# Patient Record
Sex: Male | Born: 1970 | Race: White | Hispanic: No | Marital: Single | Smoking: Never smoker
Health system: Southern US, Community
[De-identification: ages and names within clinical notes are randomized; demographics above are authoritative.]

---

## 2010-04-25 ENCOUNTER — Inpatient Hospital Stay: Payer: Self-pay | Admitting: Surgery

## 2016-02-18 ENCOUNTER — Encounter: Payer: Self-pay | Admitting: Emergency Medicine

## 2016-02-18 ENCOUNTER — Emergency Department
Admission: EM | Admit: 2016-02-18 | Discharge: 2016-02-18 | Disposition: A | Discharge status: Home / Self Care | Payer: Managed Care, Other (non HMO) | Attending: Emergency Medicine | Admitting: Emergency Medicine

## 2016-02-18 ENCOUNTER — Emergency Department: Payer: Managed Care, Other (non HMO)

## 2016-02-18 DIAGNOSIS — R109 Unspecified abdominal pain: Secondary | ICD-10-CM | POA: Diagnosis present

## 2016-02-18 DIAGNOSIS — N202 Calculus of kidney with calculus of ureter: Secondary | ICD-10-CM | POA: Diagnosis not present

## 2016-02-18 DIAGNOSIS — N200 Calculus of kidney: Secondary | ICD-10-CM

## 2016-02-18 LAB — CBC
HEMATOCRIT: 46.3 % (ref 40.0–52.0)
HEMOGLOBIN: 15.9 g/dL (ref 13.0–18.0)
MCH: 30.3 pg (ref 26.0–34.0)
MCHC: 34.3 g/dL (ref 32.0–36.0)
MCV: 88.3 fL (ref 80.0–100.0)
Platelets: 377 10*3/uL (ref 150–440)
RBC: 5.24 MIL/uL (ref 4.40–5.90)
RDW: 12.8 % (ref 11.5–14.5)
WBC: 12.2 10*3/uL — ABNORMAL HIGH (ref 3.8–10.6)

## 2016-02-18 LAB — URINALYSIS COMPLETE WITH MICROSCOPIC (ARMC ONLY)
BACTERIA UA: NONE SEEN
Bilirubin Urine: NEGATIVE
GLUCOSE, UA: NEGATIVE mg/dL
KETONES UR: NEGATIVE mg/dL
Leukocytes, UA: NEGATIVE
NITRITE: NEGATIVE
PROTEIN: 30 mg/dL — AB
SPECIFIC GRAVITY, URINE: 1.018 (ref 1.005–1.030)
Squamous Epithelial / LPF: NONE SEEN
pH: 5 (ref 5.0–8.0)

## 2016-02-18 LAB — COMPREHENSIVE METABOLIC PANEL
ALBUMIN: 4.4 g/dL (ref 3.5–5.0)
ALK PHOS: 78 U/L (ref 38–126)
ALT: 28 U/L (ref 17–63)
AST: 27 U/L (ref 15–41)
Anion gap: 8 (ref 5–15)
BUN: 12 mg/dL (ref 6–20)
CALCIUM: 9.3 mg/dL (ref 8.9–10.3)
CO2: 28 mmol/L (ref 22–32)
Chloride: 103 mmol/L (ref 101–111)
Creatinine, Ser: 0.96 mg/dL (ref 0.61–1.24)
GFR calc non Af Amer: >60 mL/min (ref >60)
GLUCOSE: 145 mg/dL — AB (ref 65–99)
Potassium: 3.6 mmol/L (ref 3.5–5.1)
Sodium: 139 mmol/L (ref 135–145)
Total Bilirubin: 0.9 mg/dL (ref 0.3–1.2)
Total Protein: 8.3 g/dL — ABNORMAL HIGH (ref 6.5–8.1)

## 2016-02-18 LAB — LIPASE, BLOOD: Lipase: 25 U/L (ref 11–51)

## 2016-02-18 MED ORDER — TAMSULOSIN HCL 0.4 MG PO CAPS
0.4000 mg | ORAL_CAPSULE | Freq: Every day | ORAL | 0 refills | Status: DC
Start: 2016-02-18 — End: 2020-01-12

## 2016-02-18 MED ORDER — ONDANSETRON HCL 4 MG/2ML IJ SOLN
4.0000 mg | Freq: Once | INTRAMUSCULAR | Status: AC
  Administered 2016-02-18: 4 mg via INTRAVENOUS

## 2016-02-18 MED ORDER — OXYCODONE-ACETAMINOPHEN 5-325 MG PO TABS: 1.0000 | ORAL_TABLET | Freq: Four times a day (QID) | ORAL | 0 refills | Status: DC | PRN

## 2016-02-18 MED ORDER — ONDANSETRON 4 MG PO TBDP: 4.0000 mg | ORAL_TABLET | Freq: Three times a day (TID) | ORAL | 0 refills | Status: DC | PRN

## 2016-02-18 MED ORDER — SODIUM CHLORIDE 0.9 % IV BOLUS (SEPSIS)
1000.0000 mL | Freq: Once | INTRAVENOUS | Status: AC
  Administered 2016-02-18: 1000 mL via INTRAVENOUS

## 2016-02-18 MED ORDER — KETOROLAC TROMETHAMINE 30 MG/ML IJ SOLN
30.0000 mg | Freq: Once | INTRAMUSCULAR | Status: AC
  Administered 2016-02-18: 30 mg via INTRAVENOUS
  Filled 2016-02-18: qty 1

## 2016-02-18 MED ORDER — ONDANSETRON HCL 4 MG/2ML IJ SOLN
INTRAMUSCULAR | Status: AC
  Administered 2016-02-18: 4 mg via INTRAVENOUS
  Filled 2016-02-18: qty 2

## 2016-02-18 NOTE — ED Notes (Signed)
Patient transported to CT 

## 2016-02-18 NOTE — ED Notes (Signed)
Pt c/o right flank pain with no hx (pos family hx) of KS.  Pt states the pain is radiating around his anterior abdomen on the right side.  Pt denies any blood in urine and states his pain is 8/10.

## 2016-02-18 NOTE — ED Notes (Signed)
MD at bedside. 

## 2016-02-18 NOTE — ED Provider Notes (Signed)
[facility] Emergency Department Provider Note  Time seen: 6:25 AM  I have reviewed the triage vital signs and the nursing notes.   HISTORY  Chief Complaint Flank Pain    HPI Manuel Rogers is a 45 y.o. male with no past medical history who presents the emergency department with right flank pain. According to the patient last night he urinated and had a sensation that he needed to urinate again but could not. This morning he states he urinated and had the same feeling, approximately 10 minutes later he developed sudden onset sharp severe pain in his right flank area. The patient states nausea but denies vomiting. Denies fever but states cold sweats at home.  History reviewed. No pertinent past medical history.  There are no active problems to display for this patient.   History reviewed. No pertinent surgical history.  Prior to Admission medications   Not on File    No Known Allergies  History reviewed. No pertinent family history.  Social History Social History  Substance Use Topics  . Smoking status: Never Smoker  . Smokeless tobacco: Never Used  . Alcohol use Yes    Review of Systems Constitutional: Negative for fever. Cardiovascular: Negative for chest pain. Respiratory: Negative for shortness of breath. Gastrointestinal: Positive for right flank pain. Positive for nausea. Negative for vomiting. Negative diarrhea. Genitourinary: Negative for dysuria. Negative for hematuria Musculoskeletal: Right back pain Neurological: Negative for headache 10-point ROS otherwise negative.  ____________________________________________   PHYSICAL EXAM:  VITAL SIGNS: ED Triage Vitals  Enc Vitals Group     BP 02/18/16 0618 125/83     Pulse Rate 02/18/16 0618 66     Resp 02/18/16 0614 18     Temp 02/18/16 0618 97.9 F (36.6 C)     Temp Source 02/18/16 0618 Oral     SpO2 02/18/16 0618 99 %     Weight --      Height --      Head Circumference --       Peak Flow --      Pain Score 02/18/16 0616 8     Pain Loc --      Pain Edu? --      Excl. in GC? --     Constitutional: Alert and oriented. Well appearing and in no distress. Eyes: Normal exam ENT   Head: Normocephalic and atraumatic.   Mouth/Throat: Mucous membranes are moist. Cardiovascular: Normal rate, regular rhythm. No murmur Respiratory: Normal respiratory effort without tachypnea nor retractions. Breath sounds are clear Gastrointestinal: Soft and nontender. No distention.  Mild right CVA tenderness palpation Musculoskeletal: Nontender with normal range of motion in all extremities. Neurologic:  Normal speech and language. No gross focal neurologic deficits Skin:  Skin is warm, dry and intact.  Psychiatric: Mood and affect are normal.   ____________________________________________   RADIOLOGY  CT shows 2 mm right distal ureteral stone. Also a 4.6 cm left renal mass/cyst.  ____________________________________________   INITIAL IMPRESSION / ASSESSMENT AND PLAN / ED COURSE  Pertinent labs & imaging results that were available during my care of the patient were reviewed by me and considered in my medical decision making (see chart for details).  The patient presents to the emergency department with sudden onset of right flank pain. Patient's story sounds very suggestive of ureterolithiasis. Patient has no history of kidney stones in the past. We will check labs, urinalysis, treat pain and nausea, IV hydrate and obtain a CT scan of the right flank  to further evaluate.  I discussed the CT findings with the patient including the kidney stone as well as the left renal mass. Patient [facility] for further workup including renal ultrasound. I discussed the possibility of cancer and the importance of following up, the patient understands and will do so. Patient states his pain is currently a  1/10.  ____________________________________________   FINAL CLINICAL IMPRESSION(S) / ED DIAGNOSES  Right flank pain Ureterolithiasis Kidney mass   Minna AntisKevin Adrick Kestler, MD 02/18/16 [phone]

## 2016-02-18 NOTE — Discharge Instructions (Signed)
As we have discussed your CT shows a right sided kidney stone. The CT scan also shows a 4.6 cm mass on the left kidney. [facility] by calling the number provided above to arrange a follow-up appointment as soon as possible for further evaluation and to obtain a renal ultrasound.

## 2016-02-18 NOTE — ED Triage Notes (Signed)
Pt arrived to the ED for complaints of right flank pain. Pt is AOx4 in no apparent distress.

## 2016-02-19 LAB — URINE CULTURE: Culture: <10000 — AB

## 2016-02-24 ENCOUNTER — Telehealth: Payer: Self-pay | Admitting: Emergency Medicine

## 2016-02-24 NOTE — Telephone Encounter (Addendum)
Called patient to ask about follow up plans for incidental finding of renal mass.  Would like to know if patient has pcp.  Left message asking him to call me.  03/01/2016--called patient again as he has not called me back.  Left message again.

## 2016-02-25 NOTE — Telephone Encounter (Signed)
Left message asking him to call me about follow up plans

## 2020-01-12 ENCOUNTER — Emergency Department (HOSPITAL_COMMUNITY): Payer: 59

## 2020-01-12 ENCOUNTER — Other Ambulatory Visit: Payer: Self-pay

## 2020-01-12 ENCOUNTER — Inpatient Hospital Stay (HOSPITAL_COMMUNITY)
Admission: EM | Admit: 2020-01-12 | Discharge: 2020-01-16 | DRG: 177 | Disposition: A | Discharge status: Home / Self Care | Payer: 59 | Attending: Internal Medicine | Admitting: Internal Medicine

## 2020-01-12 DIAGNOSIS — N289 Disorder of kidney and ureter, unspecified: Secondary | ICD-10-CM | POA: Diagnosis present

## 2020-01-12 DIAGNOSIS — J1282 Pneumonia due to coronavirus disease 2019: Secondary | ICD-10-CM | POA: Diagnosis present

## 2020-01-12 DIAGNOSIS — Z9049 Acquired absence of other specified parts of digestive tract: Secondary | ICD-10-CM

## 2020-01-12 DIAGNOSIS — Z7289 Other problems related to lifestyle: Secondary | ICD-10-CM

## 2020-01-12 DIAGNOSIS — Z283 Underimmunization status: Secondary | ICD-10-CM

## 2020-01-12 DIAGNOSIS — U071 COVID-19: Principal | ICD-10-CM | POA: Diagnosis present

## 2020-01-12 DIAGNOSIS — R197 Diarrhea, unspecified: Secondary | ICD-10-CM | POA: Diagnosis present

## 2020-01-12 DIAGNOSIS — E871 Hypo-osmolality and hyponatremia: Secondary | ICD-10-CM | POA: Diagnosis not present

## 2020-01-12 DIAGNOSIS — T380X5A Adverse effect of glucocorticoids and synthetic analogues, initial encounter: Secondary | ICD-10-CM | POA: Diagnosis not present

## 2020-01-12 DIAGNOSIS — Z6837 Body mass index (BMI) 37.0-37.9, adult: Secondary | ICD-10-CM

## 2020-01-12 DIAGNOSIS — E669 Obesity, unspecified: Secondary | ICD-10-CM | POA: Diagnosis present

## 2020-01-12 DIAGNOSIS — E876 Hypokalemia: Secondary | ICD-10-CM | POA: Diagnosis not present

## 2020-01-12 DIAGNOSIS — R0902 Hypoxemia: Secondary | ICD-10-CM | POA: Diagnosis not present

## 2020-01-12 DIAGNOSIS — J9601 Acute respiratory failure with hypoxia: Secondary | ICD-10-CM | POA: Diagnosis not present

## 2020-01-12 DIAGNOSIS — D649 Anemia, unspecified: Secondary | ICD-10-CM | POA: Diagnosis present

## 2020-01-12 DIAGNOSIS — E1165 Type 2 diabetes mellitus with hyperglycemia: Secondary | ICD-10-CM | POA: Diagnosis present

## 2020-01-12 LAB — COMPREHENSIVE METABOLIC PANEL
ALT: 41 U/L (ref 0–44)
AST: 44 U/L — ABNORMAL HIGH (ref 15–41)
Albumin: 2.7 g/dL — ABNORMAL LOW (ref 3.5–5.0)
Alkaline Phosphatase: 99 U/L (ref 38–126)
Anion gap: 15 (ref 5–15)
BUN: 11 mg/dL (ref 6–20)
CO2: 25 mmol/L (ref 22–32)
Calcium: 8.5 mg/dL — ABNORMAL LOW (ref 8.9–10.3)
Chloride: 90 mmol/L — ABNORMAL LOW (ref 98–111)
Creatinine, Ser: 0.97 mg/dL (ref 0.61–1.24)
GFR calc Af Amer: >60 mL/min (ref >60)
GFR calc non Af Amer: >60 mL/min (ref >60)
Glucose, Bld: 142 mg/dL — ABNORMAL HIGH (ref 70–99)
Potassium: 3.1 mmol/L — ABNORMAL LOW (ref 3.5–5.1)
Sodium: 130 mmol/L — ABNORMAL LOW (ref 135–145)
Total Bilirubin: 1.3 mg/dL — ABNORMAL HIGH (ref 0.3–1.2)
Total Protein: 7.5 g/dL (ref 6.5–8.1)

## 2020-01-12 LAB — URINALYSIS, ROUTINE W REFLEX MICROSCOPIC
Bilirubin Urine: NEGATIVE
Glucose, UA: NEGATIVE mg/dL
Hgb urine dipstick: NEGATIVE
Ketones, ur: 20 mg/dL — AB
Leukocytes,Ua: NEGATIVE
Nitrite: NEGATIVE
Protein, ur: 100 mg/dL — AB
Specific Gravity, Urine: 1.027 (ref 1.005–1.030)
pH: 5 (ref 5.0–8.0)

## 2020-01-12 LAB — CBC
HCT: 41.8 % (ref 39.0–52.0)
Hemoglobin: 14.3 g/dL (ref 13.0–17.0)
MCH: 29.9 pg (ref 26.0–34.0)
MCHC: 34.2 g/dL (ref 30.0–36.0)
MCV: 87.3 fL (ref 80.0–100.0)
Platelets: 407 10*3/uL — ABNORMAL HIGH (ref 150–400)
RBC: 4.79 MIL/uL (ref 4.22–5.81)
RDW: 12.1 % (ref 11.5–15.5)
WBC: 14.5 10*3/uL — ABNORMAL HIGH (ref 4.0–10.5)
nRBC: 0 % (ref 0.0–0.2)

## 2020-01-12 LAB — LIPASE, BLOOD: Lipase: 30 U/L (ref 11–51)

## 2020-01-12 MED ORDER — DEXAMETHASONE SODIUM PHOSPHATE 10 MG/ML IJ SOLN
6.0000 mg | INTRAMUSCULAR | Status: DC
  Administered 2020-01-13 – 2020-01-14 (×2): 6 mg via INTRAVENOUS
  Filled 2020-01-12: qty 1
  Filled 2020-01-12 (×2): qty 0.6

## 2020-01-12 MED ORDER — FENTANYL CITRATE (PF) 100 MCG/2ML IJ SOLN
100.0000 ug | Freq: Once | INTRAMUSCULAR | Status: AC
  Administered 2020-01-12: 100 ug via INTRAVENOUS
  Filled 2020-01-12: qty 2

## 2020-01-12 MED ORDER — ONDANSETRON HCL 4 MG/2ML IJ SOLN: 4.0000 mg | Freq: Four times a day (QID) | INTRAMUSCULAR | Status: DC | PRN

## 2020-01-12 MED ORDER — ALUM & MAG HYDROXIDE-SIMETH 200-200-20 MG/5ML PO SUSP
30.0000 mL | Freq: Once | ORAL | Status: AC
Start: 2020-01-12 — End: 2020-01-12
  Administered 2020-01-12: 30 mL via ORAL
  Filled 2020-01-12: qty 30

## 2020-01-12 MED ORDER — KETOROLAC TROMETHAMINE 15 MG/ML IJ SOLN
15.0000 mg | Freq: Once | INTRAMUSCULAR | Status: AC
  Administered 2020-01-12: 15 mg via INTRAVENOUS
  Filled 2020-01-12: qty 1

## 2020-01-12 MED ORDER — ENOXAPARIN SODIUM 40 MG/0.4ML ~~LOC~~ SOLN
40.0000 mg | SUBCUTANEOUS | Status: DC
  Administered 2020-01-12 – 2020-01-13 (×2): 40 mg via SUBCUTANEOUS
  Filled 2020-01-12 (×2): qty 0.4

## 2020-01-12 MED ORDER — POTASSIUM CHLORIDE 20 MEQ PO PACK
40.0000 meq | PACK | Freq: Two times a day (BID) | ORAL | Status: DC
  Administered 2020-01-13: 40 meq via ORAL
  Filled 2020-01-12: qty 2

## 2020-01-12 MED ORDER — ACETAMINOPHEN 500 MG PO TABS
1000.0000 mg | ORAL_TABLET | Freq: Once | ORAL | Status: AC
  Administered 2020-01-12: 1000 mg via ORAL
  Filled 2020-01-12: qty 2

## 2020-01-12 MED ORDER — IOHEXOL 350 MG/ML SOLN
100.0000 mL | Freq: Once | INTRAVENOUS | Status: AC | PRN
  Administered 2020-01-12: 100 mL via INTRAVENOUS

## 2020-01-12 MED ORDER — POTASSIUM CHLORIDE CRYS ER 20 MEQ PO TBCR
40.0000 meq | EXTENDED_RELEASE_TABLET | Freq: Once | ORAL | Status: AC
  Administered 2020-01-12: 40 meq via ORAL
  Filled 2020-01-12: qty 2

## 2020-01-12 MED ORDER — CEFTRIAXONE SODIUM 1 G IJ SOLR
1.0000 g | Freq: Once | INTRAMUSCULAR | Status: AC
  Administered 2020-01-12: 1 g via INTRAVENOUS
  Filled 2020-01-12: qty 10

## 2020-01-12 MED ORDER — ACETAMINOPHEN 325 MG PO TABS: 650.0000 mg | ORAL_TABLET | Freq: Four times a day (QID) | ORAL | Status: DC | PRN

## 2020-01-12 MED ORDER — SODIUM CHLORIDE 0.9 % IV SOLN: 1.0000 g | INTRAVENOUS | Status: DC

## 2020-01-12 MED ORDER — SODIUM CHLORIDE 0.9 % IV BOLUS
1000.0000 mL | Freq: Once | INTRAVENOUS | Status: AC
  Administered 2020-01-12: 1000 mL via INTRAVENOUS

## 2020-01-12 MED ORDER — SODIUM CHLORIDE 0.9 % IV SOLN
1.0000 g | INTRAVENOUS | Status: DC
  Administered 2020-01-13 – 2020-01-14 (×2): 1 g via INTRAVENOUS
  Filled 2020-01-12 (×2): qty 10

## 2020-01-12 MED ORDER — ONDANSETRON HCL 4 MG/2ML IJ SOLN
4.0000 mg | Freq: Once | INTRAMUSCULAR | Status: AC
  Administered 2020-01-12: 4 mg via INTRAVENOUS
  Filled 2020-01-12: qty 2

## 2020-01-12 MED ORDER — SODIUM CHLORIDE 0.9 % IV BOLUS
500.0000 mL | Freq: Once | INTRAVENOUS | Status: AC
  Administered 2020-01-12: 500 mL via INTRAVENOUS

## 2020-01-12 MED ORDER — LIDOCAINE VISCOUS HCL 2 % MT SOLN
15.0000 mL | Freq: Once | OROMUCOSAL | Status: AC
  Administered 2020-01-12: 15 mL via ORAL
  Filled 2020-01-12: qty 15

## 2020-01-12 MED ORDER — DEXAMETHASONE SODIUM PHOSPHATE 10 MG/ML IJ SOLN
10.0000 mg | Freq: Once | INTRAMUSCULAR | Status: AC
Start: 2020-01-12 — End: 2020-01-12
  Administered 2020-01-12: 10 mg via INTRAVENOUS
  Filled 2020-01-12: qty 1

## 2020-01-12 MED ORDER — DEXTROSE 5 % IV SOLN: 250.0000 mg | INTRAVENOUS | Status: DC

## 2020-01-12 MED ORDER — SODIUM CHLORIDE 0.9 % IV SOLN
500.0000 mg | Freq: Once | INTRAVENOUS | Status: AC
  Administered 2020-01-12: 500 mg via INTRAVENOUS
  Filled 2020-01-12: qty 500

## 2020-01-12 MED ORDER — DEXTROSE 5 % IV SOLN
250.0000 mg | INTRAVENOUS | Status: DC
  Administered 2020-01-13 – 2020-01-15 (×2): 250 mg via INTRAVENOUS
  Filled 2020-01-12 (×7): qty 250

## 2020-01-12 NOTE — H&P (Signed)
History and Physical    Manuel Rogers ZOX:[id number] DOB: 12/14/1970 DOA: 01/12/2020  PCP: Patient, No Pcp Per  Patient coming from: Home  I have personally briefly reviewed patient's old medical records [facility]  Chief Complaint: Persistent diarrhea, nausea vomiting  HPI: Manuel Rogers is a 49 y.o. male with no past significant medical history other does not have a primary physician who presents with persistent nausea, vomiting and diarrhea.  [place] [facility] [place] [facility] and a few days following that he began to have upper abdominal pain, nausea vomiting and diarrhea several times daily.  States he was tested positive for Covid 2 weeks ago.  Is unvaccinated.  Has not really eaten in 8 days due to his GI symptoms and thinks he has lost a lot of weight.  Also has frequent nonproductive cough and occasional shortness of breath with exertion.  Denies any chest pain. Denies any tobacco, alcohol illicit drug use.  ED Course: He was febrile up to 102.6 and has intermittent hypoxia with exertion but overall remained stable on room air at rest. CBC shows leukocytosis of 14.5 and mild thrombocytosis with platelet 407. Sodium of 130, potassium of 3.1, glucose of 142.  CTA chest showed no PE but had multifocal area of mixed groundglass opacity consistent with multifocal pneumonia.  Negative CT abdomen pelvis for acute finding.  He was given 1.5 L of normal saline, potassium supplementation, Decadron and started on azithromycin and Rocephin for suspected superimposed pneumonia.   Review of Systems:  Constitutional: No Weight Change, + Fever ENT/Mouth: No sore throat, No Rhinorrhea Eyes: No Eye Pain, No Vision Changes Cardiovascular: No Chest Pain, + SOB, No PND, No Dyspnea on Exertion Respiratory: + Cough, No Sputum, No Wheezing, no Dyspnea  Gastrointestinal: +Nausea, + Vomiting, + Diarrhea, No Constipation, No Pain Genitourinary: no Urinary  Incontinence Musculoskeletal: No Arthralgias, No Myalgias Skin: No Skin Lesions, No Pruritus, Neuro: no Weakness, No Numbness Psych: No Anxiety/Panic, No Depression, no decrease appetite Heme/Lymph: No Bruising, No Bleeding  Surgical history Cholecystectomy  reports that he has never smoked. He has never used smokeless tobacco. He reports current alcohol use. He reports that he does not use drugs. Social History  No Known Allergies  Family hx Girlfriend also positive for COVID  Prior to Admission medications   Medication Sig Start Date End Date Taking? Authorizing Provider  acetaminophen (TYLENOL) 500 MG tablet Take 1,000 mg by mouth every 6 (six) hours as needed for headache (pain).   Yes [provider]  bismuth subsalicylate (PEPTO BISMOL) 262 MG/15ML suspension Take 30 mLs by mouth every 6 (six) hours as needed for diarrhea or loose stools.   Yes [provider]  ibuprofen (ADVIL) 200 MG tablet Take 400 mg by mouth every 6 (six) hours as needed for headache (pain).   Yes [provider]    Physical Exam: Vitals:   01/12/20 1615 01/12/20 1630 01/12/20 1645 01/12/20 1700  BP: 112/69 97/76 111/75 112/72  Pulse: 95 94 100 95  Resp:      Temp:      TempSrc:      SpO2: 90% (!) 88% 98% 95%    Constitutional: NAD, calm, comfortable, nontoxic appearing male laying at 30 degree incline in bed Vitals:   01/12/20 1615 01/12/20 1630 01/12/20 1645 01/12/20 1700  BP: 112/69 97/76 111/75 112/72  Pulse: 95 94 100 95  Resp:      Temp:      TempSrc:  SpO2: 90% (!) 88% 98% 95%   Eyes: PERRL, lids and conjunctivae normal ENMT: Mucous membranes are moist.  Neck: normal, supple Respiratory: clear to auscultation bilaterally, no wheezing, no crackles. Normal respiratory effort on room air. No accessory muscle use. Occasional dry cough. Cardiovascular: Regular rate and rhythm, no murmurs / rubs / gallops. No extremity edema.   Abdomen:mild epigastric  tenderness, no masses palpated.  Bowel sounds positive.  Musculoskeletal: no clubbing / cyanosis. No joint deformity upper and lower extremities. Good ROM, no contractures. Normal muscle tone.  Skin: no rashes, lesions, ulcers. No induration Neurologic: CN 2-12 grossly intact. Sensation intact, Strength 5/5 in all 4.  Psychiatric: Normal judgment and insight. Alert and oriented x 3. Normal mood.     Labs on Admission: I have personally reviewed following labs and imaging studies  CBC: Recent Labs  Lab 01/12/20 1159  WBC 14.5*  HGB 14.3  HCT 41.8  MCV 87.3  PLT 407*   Basic Metabolic Panel: Recent Labs  Lab 01/12/20 1159  NA 130*  K 3.1*  CL 90*  CO2 25  GLUCOSE 142*  BUN 11  CREATININE 0.97  CALCIUM 8.5*   GFR: CrCl cannot be calculated (Unknown ideal weight.). Liver Function Tests: Recent Labs  Lab 01/12/20 1159  AST 44*  ALT 41  ALKPHOS 99  BILITOT 1.3*  PROT 7.5  ALBUMIN 2.7*   Recent Labs  Lab 01/12/20 1159  LIPASE 30   No results for input(s): AMMONIA in the last 168 hours. Coagulation Profile: No results for input(s): INR, PROTIME in the last 168 hours. Cardiac Enzymes: No results for input(s): CKTOTAL, CKMB, CKMBINDEX, TROPONINI in the last 168 hours. BNP (last 3 results) No results for input(s): PROBNP in the last 8760 hours. HbA1C: No results for input(s): HGBA1C in the last 72 hours. CBG: No results for input(s): GLUCAP in the last 168 hours. Lipid Profile: No results for input(s): CHOL, HDL, LDLCALC, TRIG, CHOLHDL, LDLDIRECT in the last 72 hours. Thyroid Function Tests: No results for input(s): TSH, T4TOTAL, FREET4, T3FREE, THYROIDAB in the last 72 hours. Anemia Panel: No results for input(s): VITAMINB12, FOLATE, FERRITIN, TIBC, IRON, RETICCTPCT in the last 72 hours. Urine analysis:    Component Value Date/Time   COLORURINE AMBER (A) 01/12/2020 1822   APPEARANCEUR HAZY (A) 01/12/2020 1822   LABSPEC 1.027 01/12/2020 1822   PHURINE  5.0 01/12/2020 1822   GLUCOSEU NEGATIVE 01/12/2020 1822   HGBUR NEGATIVE 01/12/2020 1822   BILIRUBINUR NEGATIVE 01/12/2020 1822   KETONESUR 20 (A) 01/12/2020 1822   PROTEINUR 100 (A) 01/12/2020 1822   NITRITE NEGATIVE 01/12/2020 1822   LEUKOCYTESUR NEGATIVE 01/12/2020 1822    Radiological Exams on Admission: CT ABDOMEN PELVIS W CONTRAST  Result Date: 01/12/2020 CLINICAL DATA:  COVID-19 positive 14 days ago now unable to tolerate p.o. intake for 8 days with abdominal pain, nausea, vomiting, diarrhea, weakness and fever. EXAM: CT ANGIOGRAPHY CHEST CT ABDOMEN AND PELVIS WITH CONTRAST TECHNIQUE: Multidetector CT imaging of the chest was performed using the standard protocol during bolus administration of intravenous contrast. Multiplanar CT image reconstructions and MIPs were obtained to evaluate the vascular anatomy. Multidetector CT imaging of the abdomen and pelvis was performed using the standard protocol during bolus administration of intravenous contrast. CONTRAST:  OMNIPAQUE IOHEXOL 350 MG/ML SOLN COMPARISON:  CT 02/18/2016, ultrasound 04/25/2010 FINDINGS: CTA CHEST FINDINGS Cardiovascular: Satisfactory opacification the pulmonary arteries to the segmental level. No pulmonary artery filling defects are identified. Central pulmonary arteries are normal caliber. Normal heart  size. No pericardial effusion. The aorta is normal caliber. No acute luminal abnormality or periaortic stranding or hemorrhage. Normal 3 vessel branching of the aortic arch. Proximal great vessels are unremarkable. Mediastinum/Nodes: Few scattered subcentimeter mediastinal and hilar nodes as well as a slightly larger right paratracheal lymph node (5/20) measuring up to 1.3 cm in short axis. No other concerning mediastinal, hilar or axillary adenopathy. Thyroid gland and thoracic inlet are unremarkable. No acute abnormality of the trachea or esophagus. Lungs/Pleura: Multifocal areas of mixed ground-glass and consolidative  opacity are present in the lungs. Mild airways thickening as well. No pneumothorax or visible effusion. No convincing features of edema. Musculoskeletal: Focal soft tissue thickening and fat subcutaneous fat stranding in the soft tissues of the anterior right shoulder (5/6). No soft tissue gas or foreign body. No other acute chest wall soft tissue abnormality. No acute or suspicious osseous abnormalities of the chest wall. Mild multilevel degenerative changes in the included thoracic spine. Review of the MIP images confirms the above findings. CT ABDOMEN and PELVIS FINDINGS Hepatobiliary: Diffuse hepatic hypoattenuation compatible with hepatic steatosis. No focal liver abnormality is seen. Patient is post cholecystectomy. No significant biliary ductal dilatation. No calcified intraductal gallstones. Pancreas: Unremarkable. No pancreatic ductal dilatation or surrounding inflammatory changes. Spleen: Normal in size. No concerning splenic lesions. Adrenals/Urinary Tract: Normal adrenal glands. Kidneys enhance and excrete symmetrically. There is a intermediate attenuation lobular cystic structure arising from the upper pole left kidney measuring up to 4.6 cm in size which is not significantly changed from comparison ultrasound in 2011 favoring a benign proteinaceous cyst. Stomach/Bowel: Distal esophagus, stomach and duodenal sweep are unremarkable. No small bowel wall thickening or dilatation. No evidence of obstruction. A normal appendix is visualized. No colonic dilatation or wall thickening. Intramural fat is seen at the level of the duodenum, terminal ileum and cecum is a nonspecific finding which can reflect sequela of prior inflammation/inflammatory bowel disease as well as incidentally with a lipomatous body habitus. Vascular/Lymphatic: Minimal atherosclerotic plaque in the iliac arteries. No aortic aneurysm or ectasia. No other significant vascular findings. No suspicious or enlarged lymph nodes in the included  lymphatic chains. Reproductive: The prostate and seminal vesicles are unremarkable. Other: No abdominopelvic free fluid or free gas. No bowel containing hernias. Small fat containing umbilical and inguinal hernias. Musculoskeletal: Minimal degenerative changes in the spine. No acute or suspicious osseous lesions. Review of the MIP images confirms the above findings. IMPRESSION: 1. No evidence of pulmonary embolism. 2. Multifocal areas of mixed ground-glass and consolidative opacity in the lungs, consistent with a multifocal pneumonia in the setting of known COVID-19 positivity. 3. Focal soft tissue thickening and fat subcutaneous fat stranding in the soft tissues of the anterior right shoulder, correlate with visual inspection. No soft tissue gas or foreign body. 4. No acute intra-abdominal process. 5. Hepatic steatosis. 6. Stable intermediate attenuation partially exophytic lesion in the interpolar to upper pole left kidney. Cystic appearance on comparison ultrasound 04/25/2010 and unchanged in size or appearance from comparison CT, strongly favoring benign proteinaceous cyst. If further characterization is clinically warranted, repeat outpatient ultrasound or contrast enhanced MRI could be obtained. Electronically Signed   By: Kreg Shropshire M.D.   On: 01/12/2020 18:12   DG Chest Portable 1 View  Result Date: 01/12/2020 CLINICAL DATA:  Cough.  COVID-19. EXAM: PORTABLE CHEST 1 VIEW COMPARISON:  None. FINDINGS: The heart size and mediastinal contours are within normal limits. No pneumothorax or pleural effusion is noted. Mild bibasilar subsegmental atelectasis or infiltrates  are noted. The visualized skeletal structures are unremarkable. IMPRESSION: Mild bibasilar subsegmental atelectasis or infiltrates are noted. Electronically Signed   By: Lupita Raider M.D.   On: 01/12/2020 13:10      Assessment/Plan COVID infection with GI symptoms and possible superimposed multifocal pneumonia Admit stable on room  air but does have some hypoxia with ambulation Maintain O2 > 92%  Daily IV Decadron  Continue IV Rocephin and azithromycin for possible superimposed pneumonia as patient has leukocytosis.  Check PCT to help with any antibiotic de-escalation. Monitor inflammatory markers  Hypokalemia Will replete  Mild Hyponatremia Has received 1.5 normal saline fluid.  Will repeat BMP in the morning     Status is: Observation  The patient remains OBS appropriate and will d/c before 2 midnights.  Dispo: The patient is from: Home              Anticipated d/c is to: Home              Anticipated d/c date is: 1 day- possible discharge in the morning pending improvement              Patient currently is not medically stable to d/c.         Anselm Jungling DO [facility]   If 7PM-7AM, please contact night-coverage [email or web address]   01/12/2020, 7:40 PM

## 2020-01-12 NOTE — ED Notes (Signed)
Pulse ox while ambulating 93 

## 2020-01-12 NOTE — ED Triage Notes (Signed)
Pt dx with covid 14 days ago. Unable to tolerate PO intake x 8 days. Endorses abdominal pain, n/v/d, weakness, and fever.

## 2020-01-12 NOTE — ED Provider Notes (Signed)
[facility] EMERGENCY DEPARTMENT Provider Note   CSN: [id number] Arrival date & time: 01/12/20  1146     History Chief Complaint  Patient presents with  . Diarrhea    Manuel Rogers is a 49 y.o. male who presents for evaluation of generalized abdominal pain, diarrhea, generalized weakness, fever that has been ongoing for 1 week.  He reports that he started having symptoms 2 weeks ago and was tested and tested positive for Covid.  He states that since last week, he has had diarrhea, decreased appetite, abdominal pain.  He states he has had nausea but has not had any vomiting.  He states that sometimes his abdomen hurts all over and more times it is in the upper abdomen.  He states that he has not had any appetite when he tries to eat or drink something, it hurts his upper abdomen and makes him nauseous.  He reports 2-3 episodes of nonbloody diarrhea a day.  He states he has continued to have fever and has been taking Tylenol and ibuprofen for fever as needed.  He has had a slight cough productive of phlegm.  He reports that when he gets up and walks around, he feels more short of breath, more generalized weakness.  He does not smoke.  No history of asthma or COPD.  He denies any numbness/weakness of arms or legs.  Did not get Covid vaccinated.  The history is provided by the patient.       No past medical history on file.  There are no problems to display for this patient.   No past surgical history on file.     No family history on file.  Social History   Tobacco Use  . Smoking status: Never Smoker  . Smokeless tobacco: Never Used  Substance Use Topics  . Alcohol use: Yes  . Drug use: No    Home Medications Prior to Admission medications   Medication Sig Start Date End Date Taking? Authorizing Provider  acetaminophen (TYLENOL) 500 MG tablet Take 1,000 mg by mouth every 6 (six) hours as needed for headache (pain).   Yes [provider]  bismuth  subsalicylate (PEPTO BISMOL) 262 MG/15ML suspension Take 30 mLs by mouth every 6 (six) hours as needed for diarrhea or loose stools.   Yes [provider]  ibuprofen (ADVIL) 200 MG tablet Take 400 mg by mouth every 6 (six) hours as needed for headache (pain).   Yes [provider]    Allergies    Patient has no known allergies.  Review of Systems   Review of Systems  Constitutional: Positive for activity change, appetite change and fever.  Respiratory: Positive for cough. Negative for shortness of breath.   Cardiovascular: Negative for chest pain.  Gastrointestinal: Positive for abdominal pain, diarrhea and nausea. Negative for vomiting.  Genitourinary: Negative for dysuria and hematuria.  Neurological: Positive for weakness (generalized). Negative for tremors and headaches.  All other systems reviewed and are negative.   Physical Exam Updated Vital Signs BP 112/72   Pulse 95   Temp 98.9 F (37.2 C) (Oral)   Resp 18   SpO2 95%   Physical Exam Vitals and nursing note reviewed.  Constitutional:      Appearance: Normal appearance. He is well-developed.  HENT:     Head: Normocephalic and atraumatic.  Eyes:     General: Lids are normal.     Conjunctiva/sclera: Conjunctivae normal.     Pupils: Pupils are equal, round, and reactive  to light.  Cardiovascular:     Rate and Rhythm: Normal rate and regular rhythm.     Pulses: Normal pulses.     Heart sounds: Normal heart sounds. No murmur heard.  No friction rub. No gallop.   Pulmonary:     Effort: Pulmonary effort is normal.     Breath sounds: Normal breath sounds.     Comments: Symmetric chest rise.  No wheezing, rhonchi. No evidence of respiratory distress. Able to speak in full sentences.  Abdominal:     Palpations: Abdomen is soft. Abdomen is not rigid.     Tenderness: There is generalized abdominal tenderness and tenderness in the epigastric area. There is no guarding.     Comments: Abdomen is soft,  non-distended. Generalized tenderness which is slightly worse in the epigastric region. No rigidity, guarding. No CVA tenderness.   Musculoskeletal:        General: Normal range of motion.     Cervical back: Full passive range of motion without pain.  Skin:    General: Skin is warm and dry.     Capillary Refill: Capillary refill takes less than 2 seconds.  Neurological:     Mental Status: He is alert and oriented to person, place, and time.  Psychiatric:        Speech: Speech normal.     ED Results / Procedures / Treatments   Labs (all labs ordered are listed, but only abnormal results are displayed) Labs Reviewed  COMPREHENSIVE METABOLIC PANEL - Abnormal; Notable for the following components:      Result Value   Sodium 130 (*)    Potassium 3.1 (*)    Chloride 90 (*)    Glucose, Bld 142 (*)    Calcium 8.5 (*)    Albumin 2.7 (*)    AST 44 (*)    Total Bilirubin 1.3 (*)    All other components within normal limits  CBC - Abnormal; Notable for the following components:   WBC 14.5 (*)    Platelets 407 (*)    All other components within normal limits  URINALYSIS, ROUTINE W REFLEX MICROSCOPIC - Abnormal; Notable for the following components:   Color, Urine AMBER (*)    APPearance HAZY (*)    Ketones, ur 20 (*)    Protein, ur 100 (*)    Bacteria, UA FEW (*)    All other components within normal limits  SARS CORONAVIRUS 2 BY RT PCR (HOSPITAL ORDER, PERFORMED IN [facility])  LIPASE, BLOOD    EKG None  Radiology CT ABDOMEN PELVIS W CONTRAST  Result Date: 01/12/2020 CLINICAL DATA:  COVID-19 positive 14 days ago now unable to tolerate p.o. intake for 8 days with abdominal pain, nausea, vomiting, diarrhea, weakness and fever. EXAM: CT ANGIOGRAPHY CHEST CT ABDOMEN AND PELVIS WITH CONTRAST TECHNIQUE: Multidetector CT imaging of the chest was performed using the standard protocol during bolus administration of intravenous contrast. Multiplanar CT image reconstructions  and MIPs were obtained to evaluate the vascular anatomy. Multidetector CT imaging of the abdomen and pelvis was performed using the standard protocol during bolus administration of intravenous contrast. CONTRAST:  OMNIPAQUE IOHEXOL 350 MG/ML SOLN COMPARISON:  CT 02/18/2016, ultrasound 04/25/2010 FINDINGS: CTA CHEST FINDINGS Cardiovascular: Satisfactory opacification the pulmonary arteries to the segmental level. No pulmonary artery filling defects are identified. Central pulmonary arteries are normal caliber. Normal heart size. No pericardial effusion. The aorta is normal caliber. No acute luminal abnormality or periaortic stranding or hemorrhage. Normal 3 vessel  branching of the aortic arch. Proximal great vessels are unremarkable. Mediastinum/Nodes: Few scattered subcentimeter mediastinal and hilar nodes as well as a slightly larger right paratracheal lymph node (5/20) measuring up to 1.3 cm in short axis. No other concerning mediastinal, hilar or axillary adenopathy. Thyroid gland and thoracic inlet are unremarkable. No acute abnormality of the trachea or esophagus. Lungs/Pleura: Multifocal areas of mixed ground-glass and consolidative opacity are present in the lungs. Mild airways thickening as well. No pneumothorax or visible effusion. No convincing features of edema. Musculoskeletal: Focal soft tissue thickening and fat subcutaneous fat stranding in the soft tissues of the anterior right shoulder (5/6). No soft tissue gas or foreign body. No other acute chest wall soft tissue abnormality. No acute or suspicious osseous abnormalities of the chest wall. Mild multilevel degenerative changes in the included thoracic spine. Review of the MIP images confirms the above findings. CT ABDOMEN and PELVIS FINDINGS Hepatobiliary: Diffuse hepatic hypoattenuation compatible with hepatic steatosis. No focal liver abnormality is seen. Patient is post cholecystectomy. No significant biliary ductal dilatation. No calcified  intraductal gallstones. Pancreas: Unremarkable. No pancreatic ductal dilatation or surrounding inflammatory changes. Spleen: Normal in size. No concerning splenic lesions. Adrenals/Urinary Tract: Normal adrenal glands. Kidneys enhance and excrete symmetrically. There is a intermediate attenuation lobular cystic structure arising from the upper pole left kidney measuring up to 4.6 cm in size which is not significantly changed from comparison ultrasound in 2011 favoring a benign proteinaceous cyst. Stomach/Bowel: Distal esophagus, stomach and duodenal sweep are unremarkable. No small bowel wall thickening or dilatation. No evidence of obstruction. A normal appendix is visualized. No colonic dilatation or wall thickening. Intramural fat is seen at the level of the duodenum, terminal ileum and cecum is a nonspecific finding which can reflect sequela of prior inflammation/inflammatory bowel disease as well as incidentally with a lipomatous body habitus. Vascular/Lymphatic: Minimal atherosclerotic plaque in the iliac arteries. No aortic aneurysm or ectasia. No other significant vascular findings. No suspicious or enlarged lymph nodes in the included lymphatic chains. Reproductive: The prostate and seminal vesicles are unremarkable. Other: No abdominopelvic free fluid or free gas. No bowel containing hernias. Small fat containing umbilical and inguinal hernias. Musculoskeletal: Minimal degenerative changes in the spine. No acute or suspicious osseous lesions. Review of the MIP images confirms the above findings. IMPRESSION: 1. No evidence of pulmonary embolism. 2. Multifocal areas of mixed ground-glass and consolidative opacity in the lungs, consistent with a multifocal pneumonia in the setting of known COVID-19 positivity. 3. Focal soft tissue thickening and fat subcutaneous fat stranding in the soft tissues of the anterior right shoulder, correlate with visual inspection. No soft tissue gas or foreign body. 4. No acute  intra-abdominal process. 5. Hepatic steatosis. 6. Stable intermediate attenuation partially exophytic lesion in the interpolar to upper pole left kidney. Cystic appearance on comparison ultrasound 04/25/2010 and unchanged in size or appearance from comparison CT, strongly favoring benign proteinaceous cyst. If further characterization is clinically warranted, repeat outpatient ultrasound or contrast enhanced MRI could be obtained. Electronically Signed   By: Kreg Shropshire M.D.   On: 01/12/2020 18:12   DG Chest Portable 1 View  Result Date: 01/12/2020 CLINICAL DATA:  Cough.  COVID-19. EXAM: PORTABLE CHEST 1 VIEW COMPARISON:  None. FINDINGS: The heart size and mediastinal contours are within normal limits. No pneumothorax or pleural effusion is noted. Mild bibasilar subsegmental atelectasis or infiltrates are noted. The visualized skeletal structures are unremarkable. IMPRESSION: Mild bibasilar subsegmental atelectasis or infiltrates are noted. Electronically Signed  By: Lupita Raider M.D.   On: 01/12/2020 13:10    Procedures Procedures (including critical care time)  Medications Ordered in ED Medications  cefTRIAXone (ROCEPHIN) 1 g in sodium chloride 0.9 % 100 mL IVPB (has no administration in time range)  azithromycin (ZITHROMAX) 500 mg in sodium chloride 0.9 % 250 mL IVPB (has no administration in time range)  dexamethasone (DECADRON) injection 10 mg (has no administration in time range)  acetaminophen (TYLENOL) tablet 1,000 mg (1,000 mg Oral Given 01/12/20 1204)  sodium chloride 0.9 % bolus 1,000 mL (0 mLs Intravenous Stopped 01/12/20 1641)  ondansetron (ZOFRAN) injection 4 mg (4 mg Intravenous Given 01/12/20 1303)  fentaNYL (SUBLIMAZE) injection 100 mcg (100 mcg Intravenous Given 01/12/20 1303)  ketorolac (TORADOL) 15 MG/ML injection 15 mg (15 mg Intravenous Given 01/12/20 1349)  potassium chloride SA (KLOR-CON) CR tablet 40 mEq (40 mEq Oral Given 01/12/20 1348)  alum & mag hydroxide-simeth  (MAALOX/MYLANTA) 200-200-20 MG/5ML suspension 30 mL (30 mLs Oral Given 01/12/20 1640)    And  lidocaine (XYLOCAINE) 2 % viscous mouth solution 15 mL (15 mLs Oral Given 01/12/20 1641)  sodium chloride 0.9 % bolus 500 mL (500 mLs Intravenous New Bag/Given 01/12/20 1640)  iohexol (OMNIPAQUE) 350 MG/ML injection 100 mL (100 mLs Intravenous Contrast Given 01/12/20 1721)    ED Course  I have reviewed the triage vital signs and the nursing notes.  Pertinent labs & imaging results that were available during my care of the patient were reviewed by me and considered in my medical decision making (see chart for details).    MDM Rules/Calculators/A&P                          49 year old male who presents for evaluation of diarrhea, decreased appetite, nausea, abdominal pain x1 week.  Diagnosed with Covid 2 weeks ago.  Continues to have symptoms.  He did not get vaccinated.  On initial ED arrival, he is febrile, slightly tachycardic.  Vitals otherwise stable.  On exam, he has some generalized tenderness slightly worse in the epigastric region.  No focal tenderness noted McBurney's point.  Concern for viral GI illness versus symptom from Covid.  History/physical exam is not concerning for appendicitis, diverticulitis.  Plan to check labs, give fluids, analgesics.  CMP shows potassium of 3.1.  BUN and creatinine within normal limits.  CBC shows slight leukocytosis at 14.5.  Lipase is unremarkable.  Chest x-ray shows developing infiltrates.  Reevaluation.  Patient reports feeling somewhat improved after fluids.  He still having some abdominal pain.  This is mostly in the epigastric region.  No tenderness noted to the lower abdomen that would be concerning for appendicitis.  He has not had any diarrhea since being here in the ED.  At this time, he is 2 weeks out from his initial Covid infection and still febrile with leukocytosis of 14.5 as opposed to leukopenia that is commonly seen in COVID-19 infections.  Given  his symptoms, will plan for CT scan to evaluate for any infectious process.  Patient initially ambulated while maintaining O2 sats of 93% on room air.  During his course in the ED, he dropped down to 87% on room air.  Given that he has questionable signs of pneumonia on his chest x-ray, will plan for CT chest for evaluation of any PE.  CTA shows no evidence of PE.  He has multifocal areas of mixed groundglass opacities consistent with multifocal pneumonia.  Discussed results with  patient.  Patient with fluctuating O2 sats.  He initially came in and was hanging around 94 and actually ambulated while maintaining O2 sats of 83.  Shortly after ambulating, he dropped down to 89% and got as low as 87% on room air.  He has not gone below 87% on room air.  He has no oxygen requirement at home.  I discussed with patient regarding admission versus discharge home.  After engaging in shared decision making and discussing with him regarding his fluctuating O2 sats, we discussed that admitting him to the hospital for antibiotics, steroids may be beneficial.  Patient is agreement.  Discussed patient with Dr. Rhona Leavenshiu (hospitalist). She accepts patient for admission.   Manuel Rogers was evaluated in Emergency Department on 01/12/2020 for the symptoms described in the history of present illness. He was evaluated in the context of the global COVID-19 pandemic, which necessitated consideration that the patient might be at risk for infection with the SARS-CoV-2 virus that causes COVID-19. Institutional protocols and algorithms that pertain to the evaluation of patients at risk for COVID-19 are in a state of rapid change based on information released by regulatory bodies including the CDC and federal and state organizations. These policies and algorithms were followed during the patient's care in the ED.  Portions of this note were generated with Scientist, clinical (histocompatibility and immunogenetics)Dragon dictation software. Dictation errors may occur despite best attempts at  proofreading.   Final Clinical Impression(s) / ED Diagnoses Final diagnoses:  COVID-19  Hypoxia  Pneumonia due to COVID-19 virus    Rx / DC Orders ED Discharge Orders    None       Rosana HoesLayden, Mistee Soliman A, PA-C 01/12/20 Mylinda [id number]    Knapp, Jon, MD 01/13/20 1531

## 2020-01-13 DIAGNOSIS — Z6837 Body mass index (BMI) 37.0-37.9, adult: Secondary | ICD-10-CM | POA: Diagnosis not present

## 2020-01-13 DIAGNOSIS — J1282 Pneumonia due to coronavirus disease 2019: Secondary | ICD-10-CM

## 2020-01-13 DIAGNOSIS — U071 COVID-19: Secondary | ICD-10-CM | POA: Diagnosis present

## 2020-01-13 DIAGNOSIS — J9601 Acute respiratory failure with hypoxia: Secondary | ICD-10-CM | POA: Diagnosis not present

## 2020-01-13 DIAGNOSIS — D649 Anemia, unspecified: Secondary | ICD-10-CM | POA: Diagnosis present

## 2020-01-13 DIAGNOSIS — E876 Hypokalemia: Secondary | ICD-10-CM | POA: Diagnosis present

## 2020-01-13 DIAGNOSIS — T380X5A Adverse effect of glucocorticoids and synthetic analogues, initial encounter: Secondary | ICD-10-CM | POA: Diagnosis not present

## 2020-01-13 DIAGNOSIS — E669 Obesity, unspecified: Secondary | ICD-10-CM | POA: Diagnosis present

## 2020-01-13 DIAGNOSIS — N289 Disorder of kidney and ureter, unspecified: Secondary | ICD-10-CM | POA: Diagnosis present

## 2020-01-13 DIAGNOSIS — Z7289 Other problems related to lifestyle: Secondary | ICD-10-CM | POA: Diagnosis not present

## 2020-01-13 DIAGNOSIS — E1165 Type 2 diabetes mellitus with hyperglycemia: Secondary | ICD-10-CM | POA: Diagnosis present

## 2020-01-13 DIAGNOSIS — E871 Hypo-osmolality and hyponatremia: Secondary | ICD-10-CM | POA: Diagnosis present

## 2020-01-13 DIAGNOSIS — E119 Type 2 diabetes mellitus without complications: Secondary | ICD-10-CM | POA: Diagnosis not present

## 2020-01-13 DIAGNOSIS — R197 Diarrhea, unspecified: Secondary | ICD-10-CM | POA: Diagnosis present

## 2020-01-13 DIAGNOSIS — Z283 Underimmunization status: Secondary | ICD-10-CM | POA: Diagnosis not present

## 2020-01-13 DIAGNOSIS — R0902 Hypoxemia: Secondary | ICD-10-CM | POA: Diagnosis present

## 2020-01-13 DIAGNOSIS — Z9049 Acquired absence of other specified parts of digestive tract: Secondary | ICD-10-CM | POA: Diagnosis not present

## 2020-01-13 LAB — HIV ANTIBODY (ROUTINE TESTING W REFLEX): HIV Screen 4th Generation wRfx: NONREACTIVE

## 2020-01-13 LAB — COMPREHENSIVE METABOLIC PANEL
ALT: 31 U/L (ref 0–44)
AST: 29 U/L (ref 15–41)
Albumin: 2.3 g/dL — ABNORMAL LOW (ref 3.5–5.0)
Alkaline Phosphatase: 80 U/L (ref 38–126)
Anion gap: 13 (ref 5–15)
BUN: 13 mg/dL (ref 6–20)
CO2: 25 mmol/L (ref 22–32)
Calcium: 8.3 mg/dL — ABNORMAL LOW (ref 8.9–10.3)
Chloride: 97 mmol/L — ABNORMAL LOW (ref 98–111)
Creatinine, Ser: 0.78 mg/dL (ref 0.61–1.24)
GFR calc Af Amer: >60 mL/min (ref >60)
GFR calc non Af Amer: >60 mL/min (ref >60)
Glucose, Bld: 175 mg/dL — ABNORMAL HIGH (ref 70–99)
Potassium: 3.6 mmol/L (ref 3.5–5.1)
Sodium: 135 mmol/L (ref 135–145)
Total Bilirubin: 0.7 mg/dL (ref 0.3–1.2)
Total Protein: 7 g/dL (ref 6.5–8.1)

## 2020-01-13 LAB — CBC WITH DIFFERENTIAL/PLATELET
Abs Immature Granulocytes: 0.16 10*3/uL — ABNORMAL HIGH (ref 0.00–0.07)
Basophils Absolute: 0 10*3/uL (ref 0.0–0.1)
Basophils Relative: 0 %
Eosinophils Absolute: 0 10*3/uL (ref 0.0–0.5)
Eosinophils Relative: 0 %
HCT: 37.3 % — ABNORMAL LOW (ref 39.0–52.0)
Hemoglobin: 12.3 g/dL — ABNORMAL LOW (ref 13.0–17.0)
Immature Granulocytes: 1 %
Lymphocytes Relative: 4 %
Lymphs Abs: 0.5 10*3/uL — ABNORMAL LOW (ref 0.7–4.0)
MCH: 29.2 pg (ref 26.0–34.0)
MCHC: 33 g/dL (ref 30.0–36.0)
MCV: 88.6 fL (ref 80.0–100.0)
Monocytes Absolute: 0.2 10*3/uL (ref 0.1–1.0)
Monocytes Relative: 1 %
Neutro Abs: 11.9 10*3/uL — ABNORMAL HIGH (ref 1.7–7.7)
Neutrophils Relative %: 94 %
Platelets: 359 10*3/uL (ref 150–400)
RBC: 4.21 MIL/uL — ABNORMAL LOW (ref 4.22–5.81)
RDW: 12.5 % (ref 11.5–15.5)
WBC: 12.7 10*3/uL — ABNORMAL HIGH (ref 4.0–10.5)
nRBC: 0 % (ref 0.0–0.2)

## 2020-01-13 LAB — C-REACTIVE PROTEIN: CRP: 22.8 mg/dL — ABNORMAL HIGH (ref <1.0)

## 2020-01-13 LAB — PROCALCITONIN: Procalcitonin: 1.35 ng/mL

## 2020-01-13 LAB — ABO/RH: ABO/RH(D): A POS

## 2020-01-13 NOTE — ED Notes (Signed)
Attending Provider to come and take a picture of this pts Positive COVID-19 to have placed in the chart

## 2020-01-13 NOTE — Plan of Care (Signed)
  Problem: Clinical Measurements: Goal: Respiratory complications will improve Outcome: Progressing   Problem: Activity: Goal: Risk for activity intolerance will decrease Outcome: Progressing   

## 2020-01-13 NOTE — ED Notes (Signed)
Pt presented this RN with a Positive COVID-19 Test

## 2020-01-13 NOTE — ED Notes (Signed)
Breakfast delivered. Pt siting up eating.

## 2020-01-13 NOTE — Progress Notes (Signed)
PROGRESS NOTE    Manuel Rogers  DXA:[id number] DOB: Jan 02, 1971 DOA: 01/12/2020 PCP: Manuel Rogers   Chief Complaint  Patient presents with  . Diarrhea    Brief Narrative:  Manuel Rogers is Manuel Rogers 49 y.o. male with no past significant medical history other does not have Manuel Rogers primary physician who presents with persistent nausea, vomiting and diarrhea.  Patient went to [facility] about 2 weeks ago and Manuel Rogers few days following that he began to have upper abdominal pain, nausea vomiting and diarrhea several times daily.  States he was tested positive for Covid 2 weeks ago.  Is unvaccinated.  Has not really eaten in 8 days due to his GI symptoms and thinks he has lost Manuel Rogers lot of weight.  Also has frequent nonproductive cough and occasional shortness of breath with exertion.  Denies any chest pain. Denies any tobacco, alcohol illicit drug use.  ED Course: He was febrile up to 102.6 and has intermittent hypoxia with exertion but overall remained stable on room air at rest. CBC shows leukocytosis of 14.5 and mild thrombocytosis with platelet 407. Sodium of 130, potassium of 3.1, glucose of 142.  CTA chest showed no PE but had multifocal area of mixed groundglass opacity consistent with multifocal pneumonia.  Negative CT abdomen pelvis for acute finding.  He was given 1.5 L of normal saline, potassium supplementation, Decadron and started on azithromycin and Rocephin for suspected superimposed pneumonia.  Assessment & Plan:   Active Problems:   COVID-19 virus infection   Hypokalemia   Hyponatremia   COVID-19  Acute Hypoxic Respiratory Failure 2/2 COVID 19 Pneumonia Unvaccinated CT chest with multifocal areas of ground glass and consolidative opacity in the lungs Currently satting well on 2 L  Continue iv steroids/remdesivir Continue ceftriaxone/azithromycin for elevated PCT No indication at this moment for actemra/baricitinib, also, elevated PCT would consider this prior to  starting elevated CRP, follow.  Follow d dimer.  Strict I/O, daily weights OOB, prone as able, IS, flutter  COVID-19 Labs  Recent Labs    01/13/20 0454  CRP 22.8*    No results found for: SARSCOV2NAA  Aguesia  Anosmia  Nausea  Vomiting  Diarrhea: with poor PO intake, 2/2 above Supportive care, continue to monitor CT abdomen pelvis without acute intraabdominal process  Soft tissue thickening and subcutaneous fat stranding in soft tissues of anterior R shoulder:  Follow up on exam   Hypokalemia Will replete  Mild Hyponatremia improved  Exophytic Lesion L Kidney: follow outpatient - suspect proteinaceous cyst Rogers radiology   DVT prophylaxis: lovenox Code Status: full  Family Communication: none at bedside Disposition:   Status is: Inpatient  Remains inpatient appropriate because:Inpatient level of care appropriate due to severity of illness   Dispo: The patient is from: Home              Anticipated d/c is to: Home              Anticipated d/c date is: 2 days              Patient currently is not medically stable to d/c.  Consultants:   none  Procedures:   none  Antimicrobials:  Anti-infectives (From admission, onward)   Start     Dose/Rate Route Frequency Ordered Stop   01/13/20 2100  azithromycin (ZITHROMAX) 250 mg in dextrose 5 % 125 mL IVPB        250 mg 125 mL/hr over 60 Minutes Intravenous Every 24 hours 01/12/20 1938  01/13/20 2000  cefTRIAXone (ROCEPHIN) 1 g in sodium chloride 0.9 % 100 mL IVPB        1 g 200 mL/hr over 30 Minutes Intravenous Every 24 hours 01/12/20 1938     01/12/20 1945  cefTRIAXone (ROCEPHIN) 1 g in sodium chloride 0.9 % 100 mL IVPB  Status:  Discontinued        1 g 200 mL/hr over 30 Minutes Intravenous Every 24 hours 01/12/20 1938 01/12/20 1938   01/12/20 1945  azithromycin (ZITHROMAX) 250 mg in dextrose 5 % 125 mL IVPB  Status:  Discontinued        250 mg 125 mL/hr over 60 Minutes Intravenous Every 24 hours 01/12/20  1938 01/12/20 1938   01/12/20 1845  cefTRIAXone (ROCEPHIN) 1 g in sodium chloride 0.9 % 100 mL IVPB        1 g 200 mL/hr over 30 Minutes Intravenous  Once 01/12/20 1839 01/12/20 2036   01/12/20 1845  azithromycin (ZITHROMAX) 500 mg in sodium chloride 0.9 % 250 mL IVPB        500 mg 250 mL/hr over 60 Minutes Intravenous  Once 01/12/20 1839 01/12/20 2207      Subjective: C/o nausea, vomiting, diarrhea   Objective: Vitals:   01/13/20 1527 01/13/20 1727 01/13/20 1743 01/13/20 1931  BP: 104/78 101/71  105/73  Pulse: 86 84  73  Resp: Temp: 98.4 F (36.9 C)     TempSrc: Oral     SpO2: 93% 95% 98% 95%  Weight:      Height:       No intake or output data in the 24 hours ending 01/13/20 2027 Filed Weights   01/13/20 1219  Weight: 117.9 kg    Examination:  General exam: Appears calm and comfortable  Respiratory system: Clear to auscultation. Respiratory effort normal. Cardiovascular system: S1 & S2 heard, RRR.  Gastrointestinal system: Abdomen is nondistended, soft and nontender. Central nervous system: Alert and oriented. No focal neurological deficits. Extremities: no LEE Skin: No rashes, lesions or ulcers Psychiatry: Judgement and insight appear normal. Mood & affect appropriate.     Data Reviewed: I have personally reviewed following labs and imaging studies  CBC: Recent Labs  Lab 01/12/20 1159 01/13/20 0454  WBC 14.5* 12.7*  NEUTROABS  --  11.9*  HGB 14.3 12.3*  HCT 41.8 37.3*  MCV 87.3 88.6  PLT 407* 359    Basic Metabolic Panel: Recent Labs  Lab 01/12/20 1159 01/13/20 0454  NA 130* 135  K 3.1* 3.6  CL 90* 97*  CO2 25 25  GLUCOSE 142* 175*  BUN 11 13  CREATININE 0.97 0.78  CALCIUM 8.5* 8.3*    GFR: Estimated Creatinine Clearance: 145.3 mL/min (by C-G formula based on SCr of 0.78 mg/dL).  Liver Function Tests: Recent Labs  Lab 01/12/20 1159 01/13/20 0454  AST 44* 29  ALT 41 31  ALKPHOS 99 80  BILITOT 1.3* 0.7  PROT 7.5 7.0   ALBUMIN 2.7* 2.3*    CBG: No results for input(s): GLUCAP in the last 168 hours.   No results found for this or any previous visit (from the past 240 hour(s)).       Radiology Studies: CT Angio Chest PE W and/or Wo Contrast  Result Date: 01/12/2020 CLINICAL DATA:  COVID-19 positive 14 days ago now unable to tolerate p.o. intake for 8 days with abdominal pain, nausea, vomiting, diarrhea, weakness and fever. EXAM: CT ANGIOGRAPHY CHEST CT ABDOMEN AND PELVIS WITH  CONTRAST TECHNIQUE: Multidetector CT imaging of the chest was performed using the standard protocol during bolus administration of intravenous contrast. Multiplanar CT image reconstructions and MIPs were obtained to evaluate the vascular anatomy. Multidetector CT imaging of the abdomen and pelvis was performed using the standard protocol during bolus administration of intravenous contrast. CONTRAST:  OMNIPAQUE IOHEXOL 350 MG/ML SOLN COMPARISON:  CT 02/18/2016, ultrasound 04/25/2010 FINDINGS: CTA CHEST FINDINGS Cardiovascular: Satisfactory opacification the pulmonary arteries to the segmental level. No pulmonary artery filling defects are identified. Central pulmonary arteries are normal caliber. Normal heart size. No pericardial effusion. The aorta is normal caliber. No acute luminal abnormality or periaortic stranding or hemorrhage. Normal 3 vessel branching of the aortic arch. Proximal great vessels are unremarkable. Mediastinum/Nodes: Few scattered subcentimeter mediastinal and hilar nodes as well as Manuel Rogers slightly larger right paratracheal lymph node (5/20) measuring up to 1.3 cm in short axis. No other concerning mediastinal, hilar or axillary adenopathy. Thyroid gland and thoracic inlet are unremarkable. No acute abnormality of the trachea or esophagus. Lungs/Pleura: Multifocal areas of mixed ground-glass and consolidative opacity are present in the lungs. Mild airways thickening as well. No pneumothorax or visible effusion. No  convincing features of edema. Musculoskeletal: Focal soft tissue thickening and fat subcutaneous fat stranding in the soft tissues of the anterior right shoulder (5/6). No soft tissue gas or foreign body. No other acute chest wall soft tissue abnormality. No acute or suspicious osseous abnormalities of the chest wall. Mild multilevel degenerative changes in the included thoracic spine. Review of the MIP images confirms the above findings. CT ABDOMEN and PELVIS FINDINGS Hepatobiliary: Diffuse hepatic hypoattenuation compatible with hepatic steatosis. No focal liver abnormality is seen. Patient is post cholecystectomy. No significant biliary ductal dilatation. No calcified intraductal gallstones. Pancreas: Unremarkable. No pancreatic ductal dilatation or surrounding inflammatory changes. Spleen: Normal in size. No concerning splenic lesions. Adrenals/Urinary Tract: Normal adrenal glands. Kidneys enhance and excrete symmetrically. There is Manuel Rogers intermediate attenuation lobular cystic structure arising from the upper pole left kidney measuring up to 4.6 cm in size which is not significantly changed from comparison ultrasound in 2011 favoring Manuel Rogers benign proteinaceous cyst. Stomach/Bowel: Distal esophagus, stomach and duodenal sweep are unremarkable. No small bowel wall thickening or dilatation. No evidence of obstruction. Maryann Mccall normal appendix is visualized. No colonic dilatation or wall thickening. Intramural fat is seen at the level of the duodenum, terminal ileum and cecum is Manuel Rogers nonspecific finding which can reflect sequela of prior inflammation/inflammatory bowel disease as well as incidentally with Manuel Rogers lipomatous body habitus. Vascular/Lymphatic: Minimal atherosclerotic plaque in the iliac arteries. No aortic aneurysm or ectasia. No other significant vascular findings. No suspicious or enlarged lymph nodes in the included lymphatic chains. Reproductive: The prostate and seminal vesicles are unremarkable. Other: No  abdominopelvic free fluid or free gas. No bowel containing hernias. Small fat containing umbilical and inguinal hernias. Musculoskeletal: Minimal degenerative changes in the spine. No acute or suspicious osseous lesions. Review of the MIP images confirms the above findings. IMPRESSION: 1. No evidence of pulmonary embolism. 2. Multifocal areas of mixed ground-glass and consolidative opacity in the lungs, consistent with Manuel Rogers multifocal pneumonia in the setting of known COVID-19 positivity. 3. Focal soft tissue thickening and fat subcutaneous fat stranding in the soft tissues of the anterior right shoulder, correlate with visual inspection. No soft tissue gas or foreign body. 4. No acute intra-abdominal process. 5. Hepatic steatosis. 6. Stable intermediate attenuation partially exophytic lesion in the interpolar to upper pole left kidney. Cystic appearance on  comparison ultrasound 04/25/2010 and unchanged in size or appearance from comparison CT, strongly favoring benign proteinaceous cyst. If further characterization is clinically warranted, repeat outpatient ultrasound or contrast enhanced MRI could be obtained. Electronically Signed   By: Kreg ShropshirePrice  DeHay M.D.   On: 01/12/2020 18:12   CT ABDOMEN PELVIS W CONTRAST  Result Date: 01/12/2020 CLINICAL DATA:  COVID-19 positive 14 days ago now unable to tolerate p.o. intake for 8 days with abdominal pain, nausea, vomiting, diarrhea, weakness and fever. EXAM: CT ANGIOGRAPHY CHEST CT ABDOMEN AND PELVIS WITH CONTRAST TECHNIQUE: Multidetector CT imaging of the chest was performed using the standard protocol during bolus administration of intravenous contrast. Multiplanar CT image reconstructions and MIPs were obtained to evaluate the vascular anatomy. Multidetector CT imaging of the abdomen and pelvis was performed using the standard protocol during bolus administration of intravenous contrast. CONTRAST:  100mL OMNIPAQUE IOHEXOL 350 MG/ML SOLN COMPARISON:  CT 02/18/2016,  ultrasound 04/25/2010 FINDINGS: CTA CHEST FINDINGS Cardiovascular: Satisfactory opacification the pulmonary arteries to the segmental level. No pulmonary artery filling defects are identified. Central pulmonary arteries are normal caliber. Normal heart size. No pericardial effusion. The aorta is normal caliber. No acute luminal abnormality or periaortic stranding or hemorrhage. Normal 3 vessel branching of the aortic arch. Proximal great vessels are unremarkable. Mediastinum/Nodes: Few scattered subcentimeter mediastinal and hilar nodes as well as Icker Swigert slightly larger right paratracheal lymph node (5/20) measuring up to 1.3 cm in short axis. No other concerning mediastinal, hilar or axillary adenopathy. Thyroid gland and thoracic inlet are unremarkable. No acute abnormality of the trachea or esophagus. Lungs/Pleura: Multifocal areas of mixed ground-glass and consolidative opacity are present in the lungs. Mild airways thickening as well. No pneumothorax or visible effusion. No convincing features of edema. Musculoskeletal: Focal soft tissue thickening and fat subcutaneous fat stranding in the soft tissues of the anterior right shoulder (5/6). No soft tissue gas or foreign body. No other acute chest wall soft tissue abnormality. No acute or suspicious osseous abnormalities of the chest wall. Mild multilevel degenerative changes in the included thoracic spine. Review of the MIP images confirms the above findings. CT ABDOMEN and PELVIS FINDINGS Hepatobiliary: Diffuse hepatic hypoattenuation compatible with hepatic steatosis. No focal liver abnormality is seen. Patient is post cholecystectomy. No significant biliary ductal dilatation. No calcified intraductal gallstones. Pancreas: Unremarkable. No pancreatic ductal dilatation or surrounding inflammatory changes. Spleen: Normal in size. No concerning splenic lesions. Adrenals/Urinary Tract: Normal adrenal glands. Kidneys enhance and excrete symmetrically. There is Manuel Rogers  intermediate attenuation lobular cystic structure arising from the upper pole left kidney measuring up to 4.6 cm in size which is not significantly changed from comparison ultrasound in 2011 favoring Manuel Rogers benign proteinaceous cyst. Stomach/Bowel: Distal esophagus, stomach and duodenal sweep are unremarkable. No small bowel wall thickening or dilatation. No evidence of obstruction. Manuel Rogers normal appendix is visualized. No colonic dilatation or wall thickening. Intramural fat is seen at the level of the duodenum, terminal ileum and cecum is Manuel Rogers nonspecific finding which can reflect sequela of prior inflammation/inflammatory bowel disease as well as incidentally with Manuel Rogers lipomatous body habitus. Vascular/Lymphatic: Minimal atherosclerotic plaque in the iliac arteries. No aortic aneurysm or ectasia. No other significant vascular findings. No suspicious or enlarged lymph nodes in the included lymphatic chains. Reproductive: The prostate and seminal vesicles are unremarkable. Other: No abdominopelvic free fluid or free gas. No bowel containing hernias. Small fat containing umbilical and inguinal hernias. Musculoskeletal: Minimal degenerative changes in the spine. No acute or suspicious osseous lesions. Review of the  MIP images confirms the above findings. IMPRESSION: 1. No evidence of pulmonary embolism. 2. Multifocal areas of mixed ground-glass and consolidative opacity in the lungs, consistent with Manuel Rogers multifocal pneumonia in the setting of known COVID-19 positivity. 3. Focal soft tissue thickening and fat subcutaneous fat stranding in the soft tissues of the anterior right shoulder, correlate with visual inspection. No soft tissue gas or foreign body. 4. No acute intra-abdominal process. 5. Hepatic steatosis. 6. Stable intermediate attenuation partially exophytic lesion in the interpolar to upper pole left kidney. Cystic appearance on comparison ultrasound 04/25/2010 and unchanged in size or appearance from comparison CT, strongly  favoring benign proteinaceous cyst. If further characterization is clinically warranted, repeat outpatient ultrasound or contrast enhanced MRI could be obtained. Electronically Signed   By: Kreg Shropshire M.D.   On: 01/12/2020 18:12   DG Chest Portable 1 View  Result Date: 01/12/2020 CLINICAL DATA:  Cough.  COVID-19. EXAM: PORTABLE CHEST 1 VIEW COMPARISON:  None. FINDINGS: The heart size and mediastinal contours are within normal limits. No pneumothorax or pleural effusion is noted. Mild bibasilar subsegmental atelectasis or infiltrates are noted. The visualized skeletal structures are unremarkable. IMPRESSION: Mild bibasilar subsegmental atelectasis or infiltrates are noted. Electronically Signed   By: Lupita Raider M.D.   On: 01/12/2020 13:10        Scheduled Meds: . dexamethasone (DECADRON) injection  6 mg Intravenous Q24H  . enoxaparin (LOVENOX) injection  40 mg Subcutaneous Q24H  . potassium chloride  40 mEq Oral BID   Continuous Infusions: . azithromycin    . cefTRIAXone (ROCEPHIN)  IV Stopped (01/13/20 2000)     LOS: 0 days    Time spent: over 30 min    Lacretia Nicks, MD [facility]   To contact the attending provider between 7A-7P or the covering provider during after hours 7P-7A, please log into the web site [email or web address] and access using universal [place] password for that web site. If you do not have the password, please call the hospital operator.  01/13/2020, 8:27 PM

## 2020-01-13 NOTE — ED Notes (Signed)
Meal Tray left at bedside and adjusted to patients liking

## 2020-01-14 DIAGNOSIS — J9601 Acute respiratory failure with hypoxia: Secondary | ICD-10-CM

## 2020-01-14 LAB — COMPREHENSIVE METABOLIC PANEL
ALT: 28 U/L (ref 0–44)
AST: 23 U/L (ref 15–41)
Albumin: 2.3 g/dL — ABNORMAL LOW (ref 3.5–5.0)
Alkaline Phosphatase: 71 U/L (ref 38–126)
Anion gap: 11 (ref 5–15)
BUN: 18 mg/dL (ref 6–20)
CO2: 28 mmol/L (ref 22–32)
Calcium: 8.7 mg/dL — ABNORMAL LOW (ref 8.9–10.3)
Chloride: 98 mmol/L (ref 98–111)
Creatinine, Ser: 0.72 mg/dL (ref 0.61–1.24)
GFR calc Af Amer: >60 mL/min (ref >60)
GFR calc non Af Amer: >60 mL/min (ref >60)
Glucose, Bld: 229 mg/dL — ABNORMAL HIGH (ref 70–99)
Potassium: 3.7 mmol/L (ref 3.5–5.1)
Sodium: 137 mmol/L (ref 135–145)
Total Bilirubin: <0.1 mg/dL — ABNORMAL LOW (ref 0.3–1.2)
Total Protein: 6.6 g/dL (ref 6.5–8.1)

## 2020-01-14 LAB — CBC WITH DIFFERENTIAL/PLATELET
Abs Immature Granulocytes: 0.1 10*3/uL — ABNORMAL HIGH (ref 0.00–0.07)
Basophils Absolute: 0 10*3/uL (ref 0.0–0.1)
Basophils Relative: 0 %
Eosinophils Absolute: 0 10*3/uL (ref 0.0–0.5)
Eosinophils Relative: 0 %
HCT: 34.8 % — ABNORMAL LOW (ref 39.0–52.0)
Hemoglobin: 11.7 g/dL — ABNORMAL LOW (ref 13.0–17.0)
Immature Granulocytes: 1 %
Lymphocytes Relative: 5 %
Lymphs Abs: 0.7 10*3/uL (ref 0.7–4.0)
MCH: 29.8 pg (ref 26.0–34.0)
MCHC: 33.6 g/dL (ref 30.0–36.0)
MCV: 88.5 fL (ref 80.0–100.0)
Monocytes Absolute: 0.4 10*3/uL (ref 0.1–1.0)
Monocytes Relative: 3 %
Neutro Abs: 12.9 10*3/uL — ABNORMAL HIGH (ref 1.7–7.7)
Neutrophils Relative %: 91 %
Platelets: 362 10*3/uL (ref 150–400)
RBC: 3.93 MIL/uL — ABNORMAL LOW (ref 4.22–5.81)
RDW: 12.3 % (ref 11.5–15.5)
WBC: 14 10*3/uL — ABNORMAL HIGH (ref 4.0–10.5)
nRBC: 0 % (ref 0.0–0.2)

## 2020-01-14 LAB — PHOSPHORUS: Phosphorus: 3 mg/dL (ref 2.5–4.6)

## 2020-01-14 LAB — FERRITIN: Ferritin: 369 ng/mL — ABNORMAL HIGH (ref 24–336)

## 2020-01-14 LAB — GLUCOSE, CAPILLARY
Glucose-Capillary: 230 mg/dL — ABNORMAL HIGH (ref 70–99)
Glucose-Capillary: 193 mg/dL — ABNORMAL HIGH (ref 70–99)

## 2020-01-14 LAB — C-REACTIVE PROTEIN: CRP: 9.2 mg/dL — ABNORMAL HIGH (ref <1.0)

## 2020-01-14 LAB — BRAIN NATRIURETIC PEPTIDE: B Natriuretic Peptide: 228.3 pg/mL — ABNORMAL HIGH (ref 0.0–100.0)

## 2020-01-14 LAB — D-DIMER, QUANTITATIVE: D-Dimer, Quant: 0.49 ug/mL-FEU (ref 0.00–0.50)

## 2020-01-14 LAB — MAGNESIUM: Magnesium: 2.2 mg/dL (ref 1.7–2.4)

## 2020-01-14 MED ORDER — INSULIN ASPART 100 UNIT/ML ~~LOC~~ SOLN
0.0000 [IU] | Freq: Three times a day (TID) | SUBCUTANEOUS | Status: DC
  Administered 2020-01-14: 7 [IU] via SUBCUTANEOUS
  Administered 2020-01-15: 4 [IU] via SUBCUTANEOUS
  Administered 2020-01-15 (×2): 7 [IU] via SUBCUTANEOUS
  Administered 2020-01-16: 4 [IU] via SUBCUTANEOUS

## 2020-01-14 MED ORDER — SODIUM CHLORIDE 0.9 % IV SOLN
200.0000 mg | Freq: Once | INTRAVENOUS | Status: AC
  Administered 2020-01-14: 200 mg via INTRAVENOUS
  Filled 2020-01-14: qty 40

## 2020-01-14 MED ORDER — ENOXAPARIN SODIUM 60 MG/0.6ML ~~LOC~~ SOLN
0.5000 mg/kg | SUBCUTANEOUS | Status: DC
  Administered 2020-01-14 – 2020-01-15 (×2): 60 mg via SUBCUTANEOUS
  Filled 2020-01-14 (×2): qty 0.6

## 2020-01-14 MED ORDER — METHYLPREDNISOLONE SODIUM SUCC 125 MG IJ SOLR
60.0000 mg | Freq: Two times a day (BID) | INTRAMUSCULAR | Status: DC
  Administered 2020-01-14 – 2020-01-16 (×4): 60 mg via INTRAVENOUS
  Filled 2020-01-14 (×4): qty 2

## 2020-01-14 MED ORDER — INSULIN ASPART 100 UNIT/ML ~~LOC~~ SOLN
0.0000 [IU] | Freq: Every day | SUBCUTANEOUS | Status: DC
  Administered 2020-01-15: 2 [IU] via SUBCUTANEOUS

## 2020-01-14 MED ORDER — SODIUM CHLORIDE 0.9 % IV SOLN
100.0000 mg | Freq: Every day | INTRAVENOUS | Status: DC
  Administered 2020-01-15 – 2020-01-16 (×2): 100 mg via INTRAVENOUS
  Filled 2020-01-14: qty 100
  Filled 2020-01-14: qty 20

## 2020-01-14 MED ORDER — FAMOTIDINE 20 MG PO TABS
20.0000 mg | ORAL_TABLET | Freq: Every day | ORAL | Status: DC
  Administered 2020-01-14 – 2020-01-16 (×3): 20 mg via ORAL
  Filled 2020-01-14 (×3): qty 1

## 2020-01-14 NOTE — Progress Notes (Addendum)
PROGRESS NOTE    Manuel Rogers  ZOX:[id number] DOB: 02/26/1971 DOA: 01/12/2020 PCP: Patient, No Pcp Per   Chief Complaint  Patient presents with   Diarrhea    Brief Narrative:  Manuel Rogers is a 49 y.o. male with no past significant medical history other does not have a primary physician who presents with persistent nausea, vomiting and diarrhea.  Patient went to [facility] about 2 weeks ago and a few days following that he began to have upper abdominal pain, nausea vomiting and diarrhea several times daily.  States he was tested positive for Covid 2 weeks ago.  Is unvaccinated.  Has not really eaten in 8 days due to his GI symptoms and thinks he has lost a lot of weight.  Also has frequent nonproductive cough and occasional shortness of breath with exertion.  Denies any chest pain. Denies any tobacco, alcohol illicit drug use.  ED Course: He was febrile up to 102.6 and has intermittent hypoxia with exertion but overall remained stable on room air at rest. CBC shows leukocytosis of 14.5 and mild thrombocytosis with platelet 407. Sodium of 130, potassium of 3.1, glucose of 142.  CTA chest showed no PE but had multifocal area of mixed groundglass opacity consistent with multifocal pneumonia.  Negative CT abdomen pelvis for acute finding.  He was given 1.5 L of normal saline, potassium supplementation, Decadron and started on azithromycin and Rocephin for suspected superimposed pneumonia.  Assessment & Plan:   Acute Hypoxic Resp. Failure/Pneumonia due to COVID-19   Recent Labs  Lab 01/12/20 1159 01/13/20 0454  CRP  --  22.8*  ALT 41 31  PROCALCITON  --  1.35    Objective findings: Fever: Noted to be afebrile Oxygen requirements: 2 L/min.  Saturating in the early 90s.  COVID 19 Therapeutics: Antibacterials: On ceftriaxone and azithromycin Remdesivir: Day one Steroids: Dexamethasone Diuretics: None yet Actemra/Baricitinib: Not initiated yet PUD Prophylaxis:  Initiate Pepcid DVT Prophylaxis:  Lovenox 60 mg daily  Patient was unvaccinated.  CT scan shows multifocal opacities.  He seems to be stable from a respiratory standpoint saturating in the early 90s on 2 L.  Patient remains on steroids.  Remdesivir initiated today.  Disease process discussed with the patient.  Need for checking inflammatory markers were also discussed.   Due to low oxygen requirements patient not currently a candidate for Actemra or baricitinib.  Patient also noted to have elevated procalcitonin.  Continue with incentive spirometry, mobilization, prone positioning as much as possible.  CRP noted to be 22.8 as of yesterday.  We will recheck it tomorrow.  Patient on dexamethasone.   Patient on antibacterials due to elevated procalcitonin. Patient also had GI symptoms thought to be secondary to Covid.  CT scan of the abdomen and pelvis did not show any acute intra-abdominal findings.  No PE noted on CT scan.  The treatment plan and use of medications and known side effects were discussed with patient/family. Some of the medications used are based on case reports/anecdotal data.  All other medications being used in the management of COVID-19 based on limited study data.  Complete risks and long-term side effects are unknown, however in the best clinical judgment they seem to be of some benefit.  Patient/family wanted to proceed with treatment options provided.  Soft tissue thickening and subcutaneous fat stranding in soft tissues of anterior R shoulder:  No concern noted on examination.  Patient mentions that he did have a boil in his armpit which burst open a few  days ago.    Hypokalemia Was repleted.  Labs are pending this morning.  Mild Hyponatremia Improved as of yesterday.  Exophytic Lesion L Kidney Need outpatient follow-up.  Suspect proteinaceous cyst per radiology  Normocytic anemia No evidence of overt bleeding.  Monitor hemoglobin closely.   DVT prophylaxis:  lovenox Code Status: Full code Family Communication: Discussed with the patient.  He will notify his family Disposition: Hopefully return home when improved.  Status is: Inpatient  Remains inpatient appropriate because:IV treatments appropriate due to intensity of illness or inability to take PO and Inpatient level of care appropriate due to severity of illness   Dispo: The patient is from: Home              Anticipated d/c is to: Home              Anticipated d/c date is: 2 days              Patient currently is not medically stable to d/c.     Consultants:   none  Procedures:   none  Antimicrobials:  Anti-infectives (From admission, onward)   Start     Dose/Rate Route Frequency Ordered Stop   01/15/20 1000  remdesivir 100 mg in sodium chloride 0.9 % 100 mL IVPB       "Followed by" Linked Group Details   100 mg 200 mL/hr over 30 Minutes Intravenous Daily 01/14/20 0918 01/19/20 0959   01/14/20 1115  remdesivir 200 mg in sodium chloride 0.9% 250 mL IVPB       "Followed by" Linked Group Details   200 mg 580 mL/hr over 30 Minutes Intravenous Once 01/14/20 0918     01/13/20 2100  azithromycin (ZITHROMAX) 250 mg in dextrose 5 % 125 mL IVPB        250 mg 125 mL/hr over 60 Minutes Intravenous Every 24 hours 01/12/20 1938     01/13/20 2000  cefTRIAXone (ROCEPHIN) 1 g in sodium chloride 0.9 % 100 mL IVPB        1 g 200 mL/hr over 30 Minutes Intravenous Every 24 hours 01/12/20 1938     01/12/20 1945  cefTRIAXone (ROCEPHIN) 1 g in sodium chloride 0.9 % 100 mL IVPB  Status:  Discontinued        1 g 200 mL/hr over 30 Minutes Intravenous Every 24 hours 01/12/20 1938 01/12/20 1938   01/12/20 1945  azithromycin (ZITHROMAX) 250 mg in dextrose 5 % 125 mL IVPB  Status:  Discontinued        250 mg 125 mL/hr over 60 Minutes Intravenous Every 24 hours 01/12/20 1938 01/12/20 1938   01/12/20 1845  cefTRIAXone (ROCEPHIN) 1 g in sodium chloride 0.9 % 100 mL IVPB        1 g 200 mL/hr over 30  Minutes Intravenous  Once 01/12/20 1839 01/12/20 2036   01/12/20 1845  azithromycin (ZITHROMAX) 500 mg in sodium chloride 0.9 % 250 mL IVPB        500 mg 250 mL/hr over 60 Minutes Intravenous  Once 01/12/20 1839 01/12/20 2207      Subjective: Had one episode of loose stool yesterday.  Some nausea but no vomiting.  Continues to have shortness of breath with dry cough.  No chest pain.   Objective: Vitals:   01/13/20 2107 01/13/20 2300 01/14/20 0600 01/14/20 0748  BP: 115/79 107/71 104/74   Pulse: 80 73 67   Resp: 16 20 20    Temp: 98.6 F (37 C) 98.5 F (  36.9 C) 97.8 F (36.6 C) 97.9 F (36.6 C)  TempSrc:  Oral Oral Oral  SpO2: 92% 96% 94%   Weight:  119 kg 119 kg   Height:        Intake/Output Summary (Last 24 hours) at 01/14/2020 1124 Last data filed at 01/14/2020 0541 Gross per 24 hour  Intake 705 ml  Output --  Net 705 ml   Filed Weights   01/13/20 1219 01/13/20 2300 01/14/20 0600  Weight: 117.9 kg 119 kg 119 kg    Examination:  General appearance: Awake alert.  In no distress Resp: Normal effort at rest.  Crackles bilateral bases.  No wheezing or rhonchi.   Cardio: S1-S2 is normal regular.  No S3-S4.  No rubs murmurs or bruit GI: Abdomen is soft.  Nontender nondistended.  Bowel sounds are present normal.  No masses organomegaly Extremities: No edema.  Full range of motion of lower extremities. Neurologic: Alert and oriented x3.  No focal neurological deficits.     Data Reviewed: I have personally reviewed following labs and imaging studies  CBC: Recent Labs  Lab 01/12/20 1159 01/13/20 0454  WBC 14.5* 12.7*  NEUTROABS  --  11.9*  HGB 14.3 12.3*  HCT 41.8 37.3*  MCV 87.3 88.6  PLT 407* 359    Basic Metabolic Panel: Recent Labs  Lab 01/12/20 1159 01/13/20 0454  NA 130* 135  K 3.1* 3.6  CL 90* 97*  CO2 25 25  GLUCOSE 142* 175*  BUN 11 13  CREATININE 0.97 0.78  CALCIUM 8.5* 8.3*    GFR: Estimated Creatinine Clearance: 146 mL/min (by C-G  formula based on SCr of 0.78 mg/dL).  Liver Function Tests: Recent Labs  Lab 01/12/20 1159 01/13/20 0454  AST 44* 29  ALT 41 31  ALKPHOS 99 80  BILITOT 1.3* 0.7  PROT 7.5 7.0  ALBUMIN 2.7* 2.3*    CBG: No results for input(s): GLUCAP in the last 168 hours.    Radiology Studies: CT Angio Chest PE W and/or Wo Contrast  Result Date: 01/12/2020 CLINICAL DATA:  COVID-19 positive 14 days ago now unable to tolerate p.o. intake for 8 days with abdominal pain, nausea, vomiting, diarrhea, weakness and fever. EXAM: CT ANGIOGRAPHY CHEST CT ABDOMEN AND PELVIS WITH CONTRAST TECHNIQUE: Multidetector CT imaging of the chest was performed using the standard protocol during bolus administration of intravenous contrast. Multiplanar CT image reconstructions and MIPs were obtained to evaluate the vascular anatomy. Multidetector CT imaging of the abdomen and pelvis was performed using the standard protocol during bolus administration of intravenous contrast. CONTRAST:  OMNIPAQUE IOHEXOL 350 MG/ML SOLN COMPARISON:  CT 02/18/2016, ultrasound 04/25/2010 FINDINGS: CTA CHEST FINDINGS Cardiovascular: Satisfactory opacification the pulmonary arteries to the segmental level. No pulmonary artery filling defects are identified. Central pulmonary arteries are normal caliber. Normal heart size. No pericardial effusion. The aorta is normal caliber. No acute luminal abnormality or periaortic stranding or hemorrhage. Normal 3 vessel branching of the aortic arch. Proximal great vessels are unremarkable. Mediastinum/Nodes: Few scattered subcentimeter mediastinal and hilar nodes as well as a slightly larger right paratracheal lymph node (5/20) measuring up to 1.3 cm in short axis. No other concerning mediastinal, hilar or axillary adenopathy. Thyroid gland and thoracic inlet are unremarkable. No acute abnormality of the trachea or esophagus. Lungs/Pleura: Multifocal areas of mixed ground-glass and consolidative opacity are  present in the lungs. Mild airways thickening as well. No pneumothorax or visible effusion. No convincing features of edema. Musculoskeletal: Focal soft tissue thickening and  fat subcutaneous fat stranding in the soft tissues of the anterior right shoulder (5/6). No soft tissue gas or foreign body. No other acute chest wall soft tissue abnormality. No acute or suspicious osseous abnormalities of the chest wall. Mild multilevel degenerative changes in the included thoracic spine. Review of the MIP images confirms the above findings. CT ABDOMEN and PELVIS FINDINGS Hepatobiliary: Diffuse hepatic hypoattenuation compatible with hepatic steatosis. No focal liver abnormality is seen. Patient is post cholecystectomy. No significant biliary ductal dilatation. No calcified intraductal gallstones. Pancreas: Unremarkable. No pancreatic ductal dilatation or surrounding inflammatory changes. Spleen: Normal in size. No concerning splenic lesions. Adrenals/Urinary Tract: Normal adrenal glands. Kidneys enhance and excrete symmetrically. There is a intermediate attenuation lobular cystic structure arising from the upper pole left kidney measuring up to 4.6 cm in size which is not significantly changed from comparison ultrasound in 2011 favoring a benign proteinaceous cyst. Stomach/Bowel: Distal esophagus, stomach and duodenal sweep are unremarkable. No small bowel wall thickening or dilatation. No evidence of obstruction. A normal appendix is visualized. No colonic dilatation or wall thickening. Intramural fat is seen at the level of the duodenum, terminal ileum and cecum is a nonspecific finding which can reflect sequela of prior inflammation/inflammatory bowel disease as well as incidentally with a lipomatous body habitus. Vascular/Lymphatic: Minimal atherosclerotic plaque in the iliac arteries. No aortic aneurysm or ectasia. No other significant vascular findings. No suspicious or enlarged lymph nodes in the included lymphatic  chains. Reproductive: The prostate and seminal vesicles are unremarkable. Other: No abdominopelvic free fluid or free gas. No bowel containing hernias. Small fat containing umbilical and inguinal hernias. Musculoskeletal: Minimal degenerative changes in the spine. No acute or suspicious osseous lesions. Review of the MIP images confirms the above findings. IMPRESSION: 1. No evidence of pulmonary embolism. 2. Multifocal areas of mixed ground-glass and consolidative opacity in the lungs, consistent with a multifocal pneumonia in the setting of known COVID-19 positivity. 3. Focal soft tissue thickening and fat subcutaneous fat stranding in the soft tissues of the anterior right shoulder, correlate with visual inspection. No soft tissue gas or foreign body. 4. No acute intra-abdominal process. 5. Hepatic steatosis. 6. Stable intermediate attenuation partially exophytic lesion in the interpolar to upper pole left kidney. Cystic appearance on comparison ultrasound 04/25/2010 and unchanged in size or appearance from comparison CT, strongly favoring benign proteinaceous cyst. If further characterization is clinically warranted, repeat outpatient ultrasound or contrast enhanced MRI could be obtained. Electronically Signed   By: Kreg Shropshire M.D.   On: 01/12/2020 18:12   CT ABDOMEN PELVIS W CONTRAST  Result Date: 01/12/2020 CLINICAL DATA:  COVID-19 positive 14 days ago now unable to tolerate p.o. intake for 8 days with abdominal pain, nausea, vomiting, diarrhea, weakness and fever. EXAM: CT ANGIOGRAPHY CHEST CT ABDOMEN AND PELVIS WITH CONTRAST TECHNIQUE: Multidetector CT imaging of the chest was performed using the standard protocol during bolus administration of intravenous contrast. Multiplanar CT image reconstructions and MIPs were obtained to evaluate the vascular anatomy. Multidetector CT imaging of the abdomen and pelvis was performed using the standard protocol during bolus administration of intravenous contrast.  CONTRAST:  OMNIPAQUE IOHEXOL 350 MG/ML SOLN COMPARISON:  CT 02/18/2016, ultrasound 04/25/2010 FINDINGS: CTA CHEST FINDINGS Cardiovascular: Satisfactory opacification the pulmonary arteries to the segmental level. No pulmonary artery filling defects are identified. Central pulmonary arteries are normal caliber. Normal heart size. No pericardial effusion. The aorta is normal caliber. No acute luminal abnormality or periaortic stranding or hemorrhage. Normal 3 vessel branching of  the aortic arch. Proximal great vessels are unremarkable. Mediastinum/Nodes: Few scattered subcentimeter mediastinal and hilar nodes as well as a slightly larger right paratracheal lymph node (5/20) measuring up to 1.3 cm in short axis. No other concerning mediastinal, hilar or axillary adenopathy. Thyroid gland and thoracic inlet are unremarkable. No acute abnormality of the trachea or esophagus. Lungs/Pleura: Multifocal areas of mixed ground-glass and consolidative opacity are present in the lungs. Mild airways thickening as well. No pneumothorax or visible effusion. No convincing features of edema. Musculoskeletal: Focal soft tissue thickening and fat subcutaneous fat stranding in the soft tissues of the anterior right shoulder (5/6). No soft tissue gas or foreign body. No other acute chest wall soft tissue abnormality. No acute or suspicious osseous abnormalities of the chest wall. Mild multilevel degenerative changes in the included thoracic spine. Review of the MIP images confirms the above findings. CT ABDOMEN and PELVIS FINDINGS Hepatobiliary: Diffuse hepatic hypoattenuation compatible with hepatic steatosis. No focal liver abnormality is seen. Patient is post cholecystectomy. No significant biliary ductal dilatation. No calcified intraductal gallstones. Pancreas: Unremarkable. No pancreatic ductal dilatation or surrounding inflammatory changes. Spleen: Normal in size. No concerning splenic lesions. Adrenals/Urinary Tract: Normal  adrenal glands. Kidneys enhance and excrete symmetrically. There is a intermediate attenuation lobular cystic structure arising from the upper pole left kidney measuring up to 4.6 cm in size which is not significantly changed from comparison ultrasound in 2011 favoring a benign proteinaceous cyst. Stomach/Bowel: Distal esophagus, stomach and duodenal sweep are unremarkable. No small bowel wall thickening or dilatation. No evidence of obstruction. A normal appendix is visualized. No colonic dilatation or wall thickening. Intramural fat is seen at the level of the duodenum, terminal ileum and cecum is a nonspecific finding which can reflect sequela of prior inflammation/inflammatory bowel disease as well as incidentally with a lipomatous body habitus. Vascular/Lymphatic: Minimal atherosclerotic plaque in the iliac arteries. No aortic aneurysm or ectasia. No other significant vascular findings. No suspicious or enlarged lymph nodes in the included lymphatic chains. Reproductive: The prostate and seminal vesicles are unremarkable. Other: No abdominopelvic free fluid or free gas. No bowel containing hernias. Small fat containing umbilical and inguinal hernias. Musculoskeletal: Minimal degenerative changes in the spine. No acute or suspicious osseous lesions. Review of the MIP images confirms the above findings. IMPRESSION: 1. No evidence of pulmonary embolism. 2. Multifocal areas of mixed ground-glass and consolidative opacity in the lungs, consistent with a multifocal pneumonia in the setting of known COVID-19 positivity. 3. Focal soft tissue thickening and fat subcutaneous fat stranding in the soft tissues of the anterior right shoulder, correlate with visual inspection. No soft tissue gas or foreign body. 4. No acute intra-abdominal process. 5. Hepatic steatosis. 6. Stable intermediate attenuation partially exophytic lesion in the interpolar to upper pole left kidney. Cystic appearance on comparison ultrasound  04/25/2010 and unchanged in size or appearance from comparison CT, strongly favoring benign proteinaceous cyst. If further characterization is clinically warranted, repeat outpatient ultrasound or contrast enhanced MRI could be obtained. Electronically Signed   By: Kreg ShropshirePrice  DeHay M.D.   On: 01/12/2020 18:12   DG Chest Portable 1 View  Result Date: 01/12/2020 CLINICAL DATA:  Cough.  COVID-19. EXAM: PORTABLE CHEST 1 VIEW COMPARISON:  None. FINDINGS: The heart size and mediastinal contours are within normal limits. No pneumothorax or pleural effusion is noted. Mild bibasilar subsegmental atelectasis or infiltrates are noted. The visualized skeletal structures are unremarkable. IMPRESSION: Mild bibasilar subsegmental atelectasis or infiltrates are noted. Electronically Signed   By: Fayrene FearingJames  Christen Butter M.D.   On: 01/12/2020 13:10        Scheduled Meds:  dexamethasone (DECADRON) injection  6 mg Intravenous Q24H   enoxaparin (LOVENOX) injection  0.5 mg/kg Subcutaneous Q24H   Continuous Infusions:  azithromycin 250 mg (01/13/20 2155)   cefTRIAXone (ROCEPHIN)  IV Stopped (01/13/20 2000)   remdesivir 200 mg in sodium chloride 0.9% 250 mL IVPB 200 mg (01/14/20 1118)   Followed by   Melene Muller ON 01/15/2020] remdesivir 100 mg in NS 100 mL       LOS: 1 day     Osvaldo Shipper, MD [facility]   To contact the attending provider between 7A-7P or the covering provider during after hours 7P-7A, please log into the web site [email or web address] and access using universal [place] password for that web site. If you do not have the password, please call the hospital operator.  01/14/2020, 11:24 AM

## 2020-01-15 DIAGNOSIS — E119 Type 2 diabetes mellitus without complications: Secondary | ICD-10-CM

## 2020-01-15 LAB — GLUCOSE, CAPILLARY
Glucose-Capillary: 190 mg/dL — ABNORMAL HIGH (ref 70–99)
Glucose-Capillary: 220 mg/dL — ABNORMAL HIGH (ref 70–99)
Glucose-Capillary: 241 mg/dL — ABNORMAL HIGH (ref 70–99)
Glucose-Capillary: 228 mg/dL — ABNORMAL HIGH (ref 70–99)

## 2020-01-15 LAB — CBC WITH DIFFERENTIAL/PLATELET
Abs Immature Granulocytes: 0.09 10*3/uL — ABNORMAL HIGH (ref 0.00–0.07)
Basophils Absolute: 0 10*3/uL (ref 0.0–0.1)
Basophils Relative: 0 %
Eosinophils Absolute: 0 10*3/uL (ref 0.0–0.5)
Eosinophils Relative: 0 %
HCT: 35.6 % — ABNORMAL LOW (ref 39.0–52.0)
Hemoglobin: 11.6 g/dL — ABNORMAL LOW (ref 13.0–17.0)
Immature Granulocytes: 1 %
Lymphocytes Relative: 8 %
Lymphs Abs: 0.8 10*3/uL (ref 0.7–4.0)
MCH: 29.3 pg (ref 26.0–34.0)
MCHC: 32.6 g/dL (ref 30.0–36.0)
MCV: 89.9 fL (ref 80.0–100.0)
Monocytes Absolute: 0.2 10*3/uL (ref 0.1–1.0)
Monocytes Relative: 2 %
Neutro Abs: 9.4 10*3/uL — ABNORMAL HIGH (ref 1.7–7.7)
Neutrophils Relative %: 89 %
Platelets: 402 10*3/uL — ABNORMAL HIGH (ref 150–400)
RBC: 3.96 MIL/uL — ABNORMAL LOW (ref 4.22–5.81)
RDW: 12.3 % (ref 11.5–15.5)
WBC: 10.5 10*3/uL (ref 4.0–10.5)
nRBC: 0 % (ref 0.0–0.2)

## 2020-01-15 LAB — HEMOGLOBIN A1C
Hgb A1c MFr Bld: 7.8 % — ABNORMAL HIGH (ref 4.8–5.6)
Mean Plasma Glucose: 177.16 mg/dL

## 2020-01-15 LAB — COMPREHENSIVE METABOLIC PANEL
ALT: 33 U/L (ref 0–44)
AST: 26 U/L (ref 15–41)
Albumin: 2.3 g/dL — ABNORMAL LOW (ref 3.5–5.0)
Alkaline Phosphatase: 61 U/L (ref 38–126)
Anion gap: 10 (ref 5–15)
BUN: 19 mg/dL (ref 6–20)
CO2: 29 mmol/L (ref 22–32)
Calcium: 8.7 mg/dL — ABNORMAL LOW (ref 8.9–10.3)
Chloride: 99 mmol/L (ref 98–111)
Creatinine, Ser: 0.84 mg/dL (ref 0.61–1.24)
GFR calc Af Amer: >60 mL/min (ref >60)
GFR calc non Af Amer: >60 mL/min (ref >60)
Glucose, Bld: 218 mg/dL — ABNORMAL HIGH (ref 70–99)
Potassium: 4.1 mmol/L (ref 3.5–5.1)
Sodium: 138 mmol/L (ref 135–145)
Total Bilirubin: 0.6 mg/dL (ref 0.3–1.2)
Total Protein: 6.2 g/dL — ABNORMAL LOW (ref 6.5–8.1)

## 2020-01-15 LAB — C-REACTIVE PROTEIN: CRP: 5.1 mg/dL — ABNORMAL HIGH (ref <1.0)

## 2020-01-15 MED ORDER — LOPERAMIDE HCL 2 MG PO CAPS
4.0000 mg | ORAL_CAPSULE | Freq: Once | ORAL | Status: AC
  Administered 2020-01-15: 4 mg via ORAL
  Filled 2020-01-15: qty 2

## 2020-01-15 MED ORDER — LIVING WELL WITH DIABETES BOOK
Freq: Once | Status: AC
  Filled 2020-01-15: qty 1

## 2020-01-15 MED ORDER — DOXYCYCLINE HYCLATE 100 MG PO TABS
100.0000 mg | ORAL_TABLET | Freq: Two times a day (BID) | ORAL | Status: DC
  Administered 2020-01-15 – 2020-01-16 (×3): 100 mg via ORAL
  Filled 2020-01-15 (×3): qty 1

## 2020-01-15 NOTE — Progress Notes (Signed)
Inpatient Diabetes Program Recommendations  AACE/ADA: New Consensus Statement on Inpatient Glycemic Control (2015)  Target Ranges:  Prepandial:   less than 140 mg/dL      Peak postprandial:   less than 180 mg/dL (1-2 hours)      Critically ill patients:  140 - 180 mg/dL   Lab Results  Component Value Date   GLUCAP 220 (H) 01/15/2020   HGBA1C 7.8 (H) 01/15/2020    Review of Glycemic Control Results for Manuel Rogers, Manuel Rogers (MRN [id number]) as of 01/15/2020 14:07  Ref. Range 01/14/2020 16:51 01/14/2020 21:56 01/15/2020 06:58 01/15/2020 12:02  Glucose-Capillary Latest Ref Range: 70 - 99 mg/dL 384 (H) 536 (H) 468 (H) 220 (H)   Diabetes history:  New DM2  Outpatient Diabetes medications:  None  Current orders for Inpatient glycemic control:  Novolog 0-20 units tid  Novolog 0-5 qhs Solumedrol 60 mg BID  Inpatient Diabetes Program Recommendations:     Novolog 2-3 units tid with meals if steroids continue  Will continue to follow while inpatient.  Thank you, Dulce Sellar, RN, BSN Diabetes Coordinator Inpatient Diabetes Program [phone] (team pager from 8a-5p)

## 2020-01-15 NOTE — Progress Notes (Signed)
PROGRESS NOTE    Manuel Rogers  MHD:[id number] DOB: Jul 12, 1970 DOA: 01/12/2020 PCP: Patient, No Pcp Per   Chief Complaint  Patient presents with   Diarrhea    Brief Narrative:  Manuel Rogers is a 49 y.o. male with no past significant medical history other does not have a primary physician who presents with persistent nausea, vomiting and diarrhea.  Patient went to [facility] about 2 weeks ago and a few days following that he began to have upper abdominal pain, nausea vomiting and diarrhea several times daily.  States he was tested positive for Covid 2 weeks ago.  Is unvaccinated.  Has not really eaten in 8 days due to his GI symptoms and thinks he has lost a lot of weight.  Also has frequent nonproductive cough and occasional shortness of breath with exertion.  Denies any chest pain. Denies any tobacco, alcohol illicit drug use.  ED Course: He was febrile up to 102.6 and has intermittent hypoxia with exertion but overall remained stable on room air at rest. CBC shows leukocytosis of 14.5 and mild thrombocytosis with platelet 407. Sodium of 130, potassium of 3.1, glucose of 142.  CTA chest showed no PE but had multifocal area of mixed groundglass opacity consistent with multifocal pneumonia.  Negative CT abdomen pelvis for acute finding.  He was given 1.5 L of normal saline, potassium supplementation, Decadron and started on azithromycin and Rocephin for suspected superimposed pneumonia.  Assessment & Plan:   Acute Hypoxic Resp. Failure/Pneumonia due to COVID-19   Recent Labs  Lab 01/12/20 1159 01/13/20 0454 01/14/20 1024 01/15/20 0456  DDIMER  --   --  0.49  --   FERRITIN  --   --  369*  --   CRP  --  22.8* 9.2* 5.1*  ALT 41 31 28 33  PROCALCITON  --  1.35  --   --     Objective findings: Fever: Remains afebrile Oxygen requirements: Noted to be on room air this morning saturating in the early 90s.    COVID 19 Therapeutics: Antibacterials: On ceftriaxone and  azithromycin.  Will change to doxycycline Remdesivir: Day 2 Steroids: Solu-Medrol Diuretics: None yet Actemra/Baricitinib: Not initiated yet PUD Prophylaxis: Pepcid DVT Prophylaxis:  Lovenox 60 mg daily  Patient was unvaccinated.  CT scan shows multifocal opacities.    From a respiratory standpoint patient seems to be improving.  He has been weaned down to room air today.  Inflammatory markers have improved.  Continue with the Remdesivir and Solu-Medrol.  Continue with incentive spirometry, mobilization and prone positioning.  Check a ambulatory pulse oximetry.   Patient was also started on ceftriaxone and azithromycin due to elevated procalcitonin.  Will change him to oral doxycycline today.   Patient also had GI symptoms thought to be secondary to Covid.  CT scan of the abdomen and pelvis did not show any acute intra-abdominal findings.  No PE noted on CT scan. Imodium for diarrhea.  Abdomen is benign.  The treatment plan and use of medications and known side effects were discussed with patient. Some of the medications used are based on case reports/anecdotal data.  All other medications being used in the management of COVID-19 based on limited study data.  Complete risks and long-term side effects are unknown, however in the best clinical judgment they seem to be of some benefit.  Patient wanted to proceed with treatment options provided.  Soft tissue thickening and subcutaneous fat stranding in soft tissues of anterior R shoulder:  No concern  noted on examination.  Patient mentions that he did have a boil in his armpit which burst open a few days ago.  Outpatient follow-up.  Newly diagnosed diabetes mellitus type 2 with hyperglycemia Patient was noted to have elevated glucose levels at admission.  Worsened likely due to steroids.  HbA1c 7.8.  This was discussed with the patient.  He will need to follow-up with his PCP for further management.  Hypokalemia Repleted.  Magnesium was  2.2.  Mild Hyponatremia Improved  Exophytic Lesion L Kidney Need outpatient follow-up.  Suspect proteinaceous cyst per radiology  Normocytic anemia No evidence of overt bleeding.  Monitor hemoglobin closely.   DVT prophylaxis: lovenox Code Status: Full code Family Communication: Discussed with the patient.  He will notify his family Disposition: Hopefully return home when improved.  Status is: Inpatient  Remains inpatient appropriate because:IV treatments appropriate due to intensity of illness or inability to take PO   Dispo: The patient is from: Home              Anticipated d/c is to: Home              Anticipated d/c date is: 1 day              Patient currently is not medically stable to d/c.     Consultants:   none  Procedures:   none  Antimicrobials:  Anti-infectives (From admission, onward)   Start     Dose/Rate Route Frequency Ordered Stop   01/15/20 1000  remdesivir 100 mg in sodium chloride 0.9 % 100 mL IVPB       "Followed by" Linked Group Details   100 mg 200 mL/hr over 30 Minutes Intravenous Daily 01/14/20 0918 01/19/20 0959   01/14/20 1115  remdesivir 200 mg in sodium chloride 0.9% 250 mL IVPB       "Followed by" Linked Group Details   200 mg 580 mL/hr over 30 Minutes Intravenous Once 01/14/20 0918 01/14/20 1148   01/13/20 2100  azithromycin (ZITHROMAX) 250 mg in dextrose 5 % 125 mL IVPB        250 mg 125 mL/hr over 60 Minutes Intravenous Every 24 hours 01/12/20 1938 01/18/20 2059   01/13/20 2000  cefTRIAXone (ROCEPHIN) 1 g in sodium chloride 0.9 % 100 mL IVPB        1 g 200 mL/hr over 30 Minutes Intravenous Every 24 hours 01/12/20 1938 01/18/20 1959   01/12/20 1945  cefTRIAXone (ROCEPHIN) 1 g in sodium chloride 0.9 % 100 mL IVPB  Status:  Discontinued        1 g 200 mL/hr over 30 Minutes Intravenous Every 24 hours 01/12/20 1938 01/12/20 1938   01/12/20 1945  azithromycin (ZITHROMAX) 250 mg in dextrose 5 % 125 mL IVPB  Status:  Discontinued         250 mg 125 mL/hr over 60 Minutes Intravenous Every 24 hours 01/12/20 1938 01/12/20 1938   01/12/20 1845  cefTRIAXone (ROCEPHIN) 1 g in sodium chloride 0.9 % 100 mL IVPB        1 g 200 mL/hr over 30 Minutes Intravenous  Once 01/12/20 1839 01/12/20 2036   01/12/20 1845  azithromycin (ZITHROMAX) 500 mg in sodium chloride 0.9 % 250 mL IVPB        500 mg 250 mL/hr over 60 Minutes Intravenous  Once 01/12/20 1839 01/12/20 2207      Subjective: Patient states that he is feeling much better.  Continues to have a cough with clear  expectoration.  Denies any blood in the sputum.  No chest pain.  No nausea vomiting.  Continues to have diarrhea however.   Objective: Vitals:   01/14/20 1551 01/14/20 1838 01/14/20 2100 01/15/20 0700  BP: 110/72 107/69 96/71 116/74  Pulse:  71 64 (!) 55  Resp:  16 17 16   Temp: 97.8 F (36.6 C) 97.9 F (36.6 C) 98.4 F (36.9 C) 97.9 F (36.6 C)  TempSrc: Oral Oral Oral Oral  SpO2: 95% 96% 96% 92%  Weight:    115.8 kg  Height:        Intake/Output Summary (Last 24 hours) at 01/15/2020 1109 Last data filed at 01/14/2020 2328 Gross per 24 hour  Intake 240 ml  Output 275 ml  Net -35 ml   Filed Weights   01/13/20 2300 01/14/20 0600 01/15/20 0700  Weight: 119 kg 119 kg 115.8 kg    Examination:  General appearance: Awake alert.  In no distress Resp: Normal effort at rest.  Crackles bilateral bases.  No wheezing or rhonchi. Cardio: S1-S2 is normal regular.  No S3-S4.  No rubs murmurs or bruit GI: Abdomen is soft.  Nontender nondistended.  Bowel sounds are present normal.  No masses organomegaly Extremities: No edema.  Full range of motion of lower extremities. Neurologic: Alert and oriented x3.  No focal neurological deficits.       Data Reviewed: I have personally reviewed following labs and imaging studies  CBC: Recent Labs  Lab 01/12/20 1159 01/13/20 0454 01/14/20 1024 01/15/20 0456  WBC 14.5* 12.7* 14.0* 10.5  NEUTROABS  --  11.9* 12.9*  9.4*  HGB 14.3 12.3* 11.7* 11.6*  HCT 41.8 37.3* 34.8* 35.6*  MCV 87.3 88.6 88.5 89.9  PLT 407* 359 362 402*    Basic Metabolic Panel: Recent Labs  Lab 01/12/20 1159 01/13/20 0454 01/14/20 1024 01/15/20 0456  NA 130* 135 137 138  K 3.1* 3.6 3.7 4.1  CL 90* 97* 98 99  CO2 25 25 28 29   GLUCOSE 142* 175* 229* 218*  BUN 11 13 18 19   CREATININE 0.97 0.78 0.72 0.84  CALCIUM 8.5* 8.3* 8.7* 8.7*  MG  --   --  2.2  --   PHOS  --   --  3.0  --     GFR: Estimated Creatinine Clearance: 137.1 mL/min (by C-G formula based on SCr of 0.84 mg/dL).  Liver Function Tests: Recent Labs  Lab 01/12/20 1159 01/13/20 0454 01/14/20 1024 01/15/20 0456  AST 44* 29 23 26   ALT 41 31 28 33  ALKPHOS 99 80 71 61  BILITOT 1.3* 0.7 <0.1* 0.6  PROT 7.5 7.0 6.6 6.2*  ALBUMIN 2.7* 2.3* 2.3* 2.3*    CBG: Recent Labs  Lab 01/14/20 1651 01/14/20 2156 01/15/20 0658  GLUCAP 230* 193* 190*      Radiology Studies: No results found.      Scheduled Meds:  enoxaparin (LOVENOX) injection  0.5 mg/kg Subcutaneous Q24H   famotidine  20 mg Oral Daily   insulin aspart  0-20 Units Subcutaneous TID WC   insulin aspart  0-5 Units Subcutaneous QHS   loperamide  4 mg Oral Once   methylPREDNISolone (SOLU-MEDROL) injection  60 mg Intravenous Q12H   Continuous Infusions:  azithromycin 250 mg (01/15/20 0021)   cefTRIAXone (ROCEPHIN)  IV 1 g (01/14/20 2311)   remdesivir 100 mg in NS 100 mL       LOS: 2 days     2157, MD [facility]   To  contact the attending provider between 7A-7P or the covering provider during after hours 7P-7A, please log into the web site [email or web address] and access using universal [employer] password for that web site. If you do not have the password, please call the hospital operator.  01/15/2020, 11:09 AM

## 2020-01-15 NOTE — Plan of Care (Signed)
  Problem: Clinical Measurements: Goal: Respiratory complications will improve Outcome: Progressing   Problem: Activity: Goal: Risk for activity intolerance will decrease Outcome: Progressing   Problem: Safety: Goal: Ability to remain free from injury will improve Outcome: Progressing   

## 2020-01-16 LAB — GLUCOSE, CAPILLARY
Glucose-Capillary: 194 mg/dL — ABNORMAL HIGH (ref 70–99)
Glucose-Capillary: 215 mg/dL — ABNORMAL HIGH (ref 70–99)

## 2020-01-16 MED ORDER — FAMOTIDINE 20 MG PO TABS: 20.0000 mg | ORAL_TABLET | Freq: Every day | ORAL | 0 refills | Status: AC

## 2020-01-16 MED ORDER — DOXYCYCLINE HYCLATE 100 MG PO TABS: 100.0000 mg | ORAL_TABLET | Freq: Two times a day (BID) | ORAL | 0 refills | Status: AC

## 2020-01-16 MED ORDER — PREDNISONE 20 MG PO TABS: ORAL_TABLET | ORAL | 0 refills | Status: AC

## 2020-01-16 NOTE — Discharge Summary (Signed)
[facility]  Physician Discharge Summary   Patient ID: Manuel Rogers MRN: [id number] DOB/AGE: 07-10-1970 49 y.o.  Admit date: 01/12/2020 Discharge date: 01/16/2020  PCP: Patient, No Pcp Per  DISCHARGE DIAGNOSES:  Pneumonia due to COVID-19 Acute respiratory failure with hypoxia, resolved Newly diagnosed diabetes mellitus type 2 Exophytic lesion left kidney Normocytic anemia Abnormal appearing right shoulder on CT scan  RECOMMENDATIONS FOR OUTPATIENT FOLLOW UP: 1. Further evaluation of the exophytic lesion noted in the left kidney in the outpatient setting 2. Further management of type 2 diabetes.  Consider patient for Metformin at follow-up 3. Appointment made for patient at the infusion clinic for the last 2 doses of Remdesivir on 9/4 and 9/5.    Home Health: None Equipment/Devices: None  CODE STATUS: Full code  DISCHARGE CONDITION: fair  Diet recommendation: Modified carbohydrate  INITIAL HISTORY: Manuel Smithis a 49 y.o.malewithno past significant medical history other does not have a primary physician who presents with persistent nausea, vomiting and diarrhea.  Patient went to [facility] about 2 weeks ago and a few days following that he began to have upper abdominal pain, nausea vomiting and diarrhea several times daily. States he was tested positive for Covid 2 weeks ago. Is unvaccinated. Has not really eaten in 8 days due to his GI symptoms and thinks he has lost a lot of weight. Also has frequent nonproductive cough and occasional shortness of breath with exertion. Denies any chest pain. Denies any tobacco, alcohol illicit drug use.  ED Course:He was febrile up to 102.6 and has intermittent hypoxia with exertion but overall remained stable on room air at rest. CBC shows leukocytosis of 14.5 and mild thrombocytosis with platelet 407. Sodium of 130, potassium of 3.1, glucose of 142.  CTA chest showed no PE but had multifocal area of mixed  groundglass opacity consistent with multifocal pneumonia. Negative CT abdomen pelvis for acute finding.  He was given 1.5 L of normal saline, potassium supplementation, Decadron and started on azithromycin and Rocephin for suspected superimposed pneumonia.   HOSPITAL COURSE:   Acute Hypoxic Resp. Failure/Pneumonia due to COVID-19 Patient was hospitalized.  He was initially requiringcannula.  Patient was started on remdesivir and steroids.  Due to elevated procalcitonin he was also started on Nexium and azithromycin.  This was transitioned to doxycycline.  Patient started improving very quickly.  He was weaned off of oxygen.  His inflammatory markers have improved.  Feels much better this morning.  He will need 2 more doses of Remdesivir which has been arranged at the infusion clinic.  Will be prescribed on tapering doses of steroids.  Antibiotics for a few more days.  There has been a lot of delay in labs today due to problems with equipment.  Since blood work from yesterday was quite stable no need to wait for blood work today.  Okay for discharge.  Patient also had GI symptoms thought to be secondary to Covid.  CT scan of the abdomen and pelvis did not show any acute intra-abdominal findings.  No PE noted on CT scan.  Patient was given Imodium for diarrhea with improvement.  Abdomen remains benign.  The treatment plan and use of medications and known side effects were discussed with patient. Some of the medications used are based on case reports/anecdotal data.  All other medications being used in the management of COVID-19 based on limited study data.  Complete risks and long-term side effects are unknown, however in the best clinical judgment they seem to be of some  benefit.  Patient wanted to proceed with treatment options provided.  Soft tissue thickening and subcutaneous fat stranding in soft tissues of anterior R shoulder:  No concern noted on examination.  Patient mentions that he did  have a boil in his armpit which burst open a few days ago.  Outpatient follow-up.  Newly diagnosed diabetes mellitus type 2 with hyperglycemia Patient was noted to have elevated glucose levels at admission.  Worsened likely due to steroids.  HbA1c 7.8.  This was discussed with the patient.  He will need to follow-up with his PCP for further management.  Might benefit from initiation of Metformin at follow-up.  Hypokalemia Repleted.  Magnesium was 2.2.  MildHyponatremia Improved  Exophytic Lesion L Kidney Need outpatient follow-up.  Suspect proteinaceous cyst per radiology  Normocytic anemia No evidence of overt bleeding.    Obesity Estimated body mass index is 37.49 kg/m as calculated from the following:   Height as of this encounter: 5\' 10"  (1.778 m).   Weight as of this encounter: 118.5 kg.  Overall stable.  Okay for discharge home today.    PERTINENT LABS:  The results of significant diagnostics from this hospitalization (including imaging, microbiology, ancillary and laboratory) are listed below for reference.     Labs:  COVID-19 Labs  Recent Labs    01/14/20 1024 01/15/20 0456  DDIMER 0.49  --   FERRITIN 369*  --   CRP 9.2* 5.1*      Basic Metabolic Panel: Recent Labs  Lab 01/12/20 1159 01/13/20 0454 01/14/20 1024 01/15/20 0456  NA 130* 135 137 138  K 3.1* 3.6 3.7 4.1  CL 90* 97* 98 99  CO2 25 25 28 29   GLUCOSE 142* 175* 229* 218*  BUN 11 13 18 19   CREATININE 0.97 0.78 0.72 0.84  CALCIUM 8.5* 8.3* 8.7* 8.7*  MG  --   --  2.2  --   PHOS  --   --  3.0  --    Liver Function Tests: Recent Labs  Lab 01/12/20 1159 01/13/20 0454 01/14/20 1024 01/15/20 0456  AST 44* 29 23 26   ALT 41 31 28 33  ALKPHOS 99 80 71 61  BILITOT 1.3* 0.7 <0.1* 0.6  PROT 7.5 7.0 6.6 6.2*  ALBUMIN 2.7* 2.3* 2.3* 2.3*   Recent Labs  Lab 01/12/20 1159  LIPASE 30   CBC: Recent Labs  Lab 01/12/20 1159 01/13/20 0454 01/14/20 1024 01/15/20 0456  WBC 14.5*  12.7* 14.0* 10.5  NEUTROABS  --  11.9* 12.9* 9.4*  HGB 14.3 12.3* 11.7* 11.6*  HCT 41.8 37.3* 34.8* 35.6*  MCV 87.3 88.6 88.5 89.9  PLT 407* 359 362 402*   BNP: BNP (last 3 results) Recent Labs    01/14/20 1024  BNP 228.3*    CBG: Recent Labs  Lab 01/15/20 1202 01/15/20 1640 01/15/20 2125 01/16/20 0550 01/16/20 1149  GLUCAP 220* 241* 228* 194* 215*     IMAGING STUDIES CT Angio Chest PE W and/or Wo Contrast  Result Date: 01/12/2020 CLINICAL DATA:  COVID-19 positive 14 days ago now unable to tolerate p.o. intake for 8 days with abdominal pain, nausea, vomiting, diarrhea, weakness and fever. EXAM: CT ANGIOGRAPHY CHEST CT ABDOMEN AND PELVIS WITH CONTRAST TECHNIQUE: Multidetector CT imaging of the chest was performed using the standard protocol during bolus administration of intravenous contrast. Multiplanar CT image reconstructions and MIPs were obtained to evaluate the vascular anatomy. Multidetector CT imaging of the abdomen and pelvis was performed using the standard protocol during bolus  administration of intravenous contrast. CONTRAST:  100mL OMNIPAQUE IOHEXOL 350 MG/ML SOLN COMPARISON:  CT 02/18/2016, ultrasound 04/25/2010 FINDINGS: CTA CHEST FINDINGS Cardiovascular: Satisfactory opacification the pulmonary arteries to the segmental level. No pulmonary artery filling defects are identified. Central pulmonary arteries are normal caliber. Normal heart size. No pericardial effusion. The aorta is normal caliber. No acute luminal abnormality or periaortic stranding or hemorrhage. Normal 3 vessel branching of the aortic arch. Proximal great vessels are unremarkable. Mediastinum/Nodes: Few scattered subcentimeter mediastinal and hilar nodes as well as a slightly larger right paratracheal lymph node (5/20) measuring up to 1.3 cm in short axis. No other concerning mediastinal, hilar or axillary adenopathy. Thyroid gland and thoracic inlet are unremarkable. No acute abnormality of the trachea  or esophagus. Lungs/Pleura: Multifocal areas of mixed ground-glass and consolidative opacity are present in the lungs. Mild airways thickening as well. No pneumothorax or visible effusion. No convincing features of edema. Musculoskeletal: Focal soft tissue thickening and fat subcutaneous fat stranding in the soft tissues of the anterior right shoulder (5/6). No soft tissue gas or foreign body. No other acute chest wall soft tissue abnormality. No acute or suspicious osseous abnormalities of the chest wall. Mild multilevel degenerative changes in the included thoracic spine. Review of the MIP images confirms the above findings. CT ABDOMEN and PELVIS FINDINGS Hepatobiliary: Diffuse hepatic hypoattenuation compatible with hepatic steatosis. No focal liver abnormality is seen. Patient is post cholecystectomy. No significant biliary ductal dilatation. No calcified intraductal gallstones. Pancreas: Unremarkable. No pancreatic ductal dilatation or surrounding inflammatory changes. Spleen: Normal in size. No concerning splenic lesions. Adrenals/Urinary Tract: Normal adrenal glands. Kidneys enhance and excrete symmetrically. There is a intermediate attenuation lobular cystic structure arising from the upper pole left kidney measuring up to 4.6 cm in size which is not significantly changed from comparison ultrasound in 2011 favoring a benign proteinaceous cyst. Stomach/Bowel: Distal esophagus, stomach and duodenal sweep are unremarkable. No small bowel wall thickening or dilatation. No evidence of obstruction. A normal appendix is visualized. No colonic dilatation or wall thickening. Intramural fat is seen at the level of the duodenum, terminal ileum and cecum is a nonspecific finding which can reflect sequela of prior inflammation/inflammatory bowel disease as well as incidentally with a lipomatous body habitus. Vascular/Lymphatic: Minimal atherosclerotic plaque in the iliac arteries. No aortic aneurysm or ectasia. No other  significant vascular findings. No suspicious or enlarged lymph nodes in the included lymphatic chains. Reproductive: The prostate and seminal vesicles are unremarkable. Other: No abdominopelvic free fluid or free gas. No bowel containing hernias. Small fat containing umbilical and inguinal hernias. Musculoskeletal: Minimal degenerative changes in the spine. No acute or suspicious osseous lesions. Review of the MIP images confirms the above findings. IMPRESSION: 1. No evidence of pulmonary embolism. 2. Multifocal areas of mixed ground-glass and consolidative opacity in the lungs, consistent with a multifocal pneumonia in the setting of known COVID-19 positivity. 3. Focal soft tissue thickening and fat subcutaneous fat stranding in the soft tissues of the anterior right shoulder, correlate with visual inspection. No soft tissue gas or foreign body. 4. No acute intra-abdominal process. 5. Hepatic steatosis. 6. Stable intermediate attenuation partially exophytic lesion in the interpolar to upper pole left kidney. Cystic appearance on comparison ultrasound 04/25/2010 and unchanged in size or appearance from comparison CT, strongly favoring benign proteinaceous cyst. If further characterization is clinically warranted, repeat outpatient ultrasound or contrast enhanced MRI could be obtained. Electronically Signed   By: Kreg ShropshirePrice  DeHay M.D.   On: 01/12/2020 18:12  CT ABDOMEN PELVIS W CONTRAST  Result Date: 01/12/2020 CLINICAL DATA:  COVID-19 positive 14 days ago now unable to tolerate p.o. intake for 8 days with abdominal pain, nausea, vomiting, diarrhea, weakness and fever. EXAM: CT ANGIOGRAPHY CHEST CT ABDOMEN AND PELVIS WITH CONTRAST TECHNIQUE: Multidetector CT imaging of the chest was performed using the standard protocol during bolus administration of intravenous contrast. Multiplanar CT image reconstructions and MIPs were obtained to evaluate the vascular anatomy. Multidetector CT imaging of the abdomen and pelvis  was performed using the standard protocol during bolus administration of intravenous contrast. CONTRAST:  OMNIPAQUE IOHEXOL 350 MG/ML SOLN COMPARISON:  CT 02/18/2016, ultrasound 04/25/2010 FINDINGS: CTA CHEST FINDINGS Cardiovascular: Satisfactory opacification the pulmonary arteries to the segmental level. No pulmonary artery filling defects are identified. Central pulmonary arteries are normal caliber. Normal heart size. No pericardial effusion. The aorta is normal caliber. No acute luminal abnormality or periaortic stranding or hemorrhage. Normal 3 vessel branching of the aortic arch. Proximal great vessels are unremarkable. Mediastinum/Nodes: Few scattered subcentimeter mediastinal and hilar nodes as well as a slightly larger right paratracheal lymph node (5/20) measuring up to 1.3 cm in short axis. No other concerning mediastinal, hilar or axillary adenopathy. Thyroid gland and thoracic inlet are unremarkable. No acute abnormality of the trachea or esophagus. Lungs/Pleura: Multifocal areas of mixed ground-glass and consolidative opacity are present in the lungs. Mild airways thickening as well. No pneumothorax or visible effusion. No convincing features of edema. Musculoskeletal: Focal soft tissue thickening and fat subcutaneous fat stranding in the soft tissues of the anterior right shoulder (5/6). No soft tissue gas or foreign body. No other acute chest wall soft tissue abnormality. No acute or suspicious osseous abnormalities of the chest wall. Mild multilevel degenerative changes in the included thoracic spine. Review of the MIP images confirms the above findings. CT ABDOMEN and PELVIS FINDINGS Hepatobiliary: Diffuse hepatic hypoattenuation compatible with hepatic steatosis. No focal liver abnormality is seen. Patient is post cholecystectomy. No significant biliary ductal dilatation. No calcified intraductal gallstones. Pancreas: Unremarkable. No pancreatic ductal dilatation or surrounding inflammatory  changes. Spleen: Normal in size. No concerning splenic lesions. Adrenals/Urinary Tract: Normal adrenal glands. Kidneys enhance and excrete symmetrically. There is a intermediate attenuation lobular cystic structure arising from the upper pole left kidney measuring up to 4.6 cm in size which is not significantly changed from comparison ultrasound in 2011 favoring a benign proteinaceous cyst. Stomach/Bowel: Distal esophagus, stomach and duodenal sweep are unremarkable. No small bowel wall thickening or dilatation. No evidence of obstruction. A normal appendix is visualized. No colonic dilatation or wall thickening. Intramural fat is seen at the level of the duodenum, terminal ileum and cecum is a nonspecific finding which can reflect sequela of prior inflammation/inflammatory bowel disease as well as incidentally with a lipomatous body habitus. Vascular/Lymphatic: Minimal atherosclerotic plaque in the iliac arteries. No aortic aneurysm or ectasia. No other significant vascular findings. No suspicious or enlarged lymph nodes in the included lymphatic chains. Reproductive: The prostate and seminal vesicles are unremarkable. Other: No abdominopelvic free fluid or free gas. No bowel containing hernias. Small fat containing umbilical and inguinal hernias. Musculoskeletal: Minimal degenerative changes in the spine. No acute or suspicious osseous lesions. Review of the MIP images confirms the above findings. IMPRESSION: 1. No evidence of pulmonary embolism. 2. Multifocal areas of mixed ground-glass and consolidative opacity in the lungs, consistent with a multifocal pneumonia in the setting of known COVID-19 positivity. 3. Focal soft tissue thickening and fat subcutaneous fat stranding in the  soft tissues of the anterior right shoulder, correlate with visual inspection. No soft tissue gas or foreign body. 4. No acute intra-abdominal process. 5. Hepatic steatosis. 6. Stable intermediate attenuation partially exophytic lesion  in the interpolar to upper pole left kidney. Cystic appearance on comparison ultrasound 04/25/2010 and unchanged in size or appearance from comparison CT, strongly favoring benign proteinaceous cyst. If further characterization is clinically warranted, repeat outpatient ultrasound or contrast enhanced MRI could be obtained. Electronically Signed   By: Kreg Shropshire M.D.   On: 01/12/2020 18:12   DG Chest Portable 1 View  Result Date: 01/12/2020 CLINICAL DATA:  Cough.  COVID-19. EXAM: PORTABLE CHEST 1 VIEW COMPARISON:  None. FINDINGS: The heart size and mediastinal contours are within normal limits. No pneumothorax or pleural effusion is noted. Mild bibasilar subsegmental atelectasis or infiltrates are noted. The visualized skeletal structures are unremarkable. IMPRESSION: Mild bibasilar subsegmental atelectasis or infiltrates are noted. Electronically Signed   By: Lupita Raider M.D.   On: 01/12/2020 13:10    DISCHARGE EXAMINATION: Vitals:   01/14/20 2100 01/15/20 0700 01/15/20 2128 01/16/20 0526  BP: 96/71 116/74 112/78 124/88  Pulse: 64 (!) 55 63 (!) 50  Resp: 17 16 19  (!) 22  Temp: 98.4 F (36.9 C) 97.9 F (36.6 C) 97.8 F (36.6 C) 98.6 F (37 C)  TempSrc: Oral Oral Oral Oral  SpO2: 96% 92% 96% 91%  Weight:  115.8 kg  118.5 kg  Height:       General appearance: Awake alert.  In no distress Resp: Normal effort at rest.  Improved aeration.  Few crackles at the bases.  No wheezing or rhonchi.   Cardio: S1-S2 is normal regular.  No S3-S4.  No rubs murmurs or bruit GI: Abdomen is soft.  Nontender nondistended.  Bowel sounds are present normal.  No masses organomegaly    DISPOSITION: Home  Discharge Instructions    Call MD for:  difficulty breathing, headache or visual disturbances   Complete by: As directed    Call MD for:  extreme fatigue   Complete by: As directed    Call MD for:  persistant dizziness or light-headedness   Complete by: As directed    Call MD for:  persistant  nausea and vomiting   Complete by: As directed    Call MD for:  severe uncontrolled pain   Complete by: As directed    Call MD for:  temperature >100.4   Complete by: As directed    Diet - low sodium heart healthy   Complete by: As directed    Discharge instructions   Complete by: As directed    Please be sure to come to the infusion clinic on 9/4 and 9/5 for the Remdesivir infusions as has been arranged. Please be sure to follow-up with your primary care provider for further management and evaluation of your newly diagnosed diabetes. You can end your isolation for COVID-19 on 01/20/2020.  The CT scan that he had during this hospital stay also showed a cyst in your left kidney which may need further evaluation in the outpatient setting.  This can also be discussed with your primary care provider.  COVID 19 INSTRUCTIONS  - You are felt to be stable enough to no longer require inpatient monitoring, testing, and treatment, though you will need to follow the recommendations below: - Based on the CDC's non-test criteria for ending self-isolation: You may not return to work/leave the home until at least 20 days since symptom onset AND  24 hours without a fever (without taking tylenol, ibuprofen, etc.) AND have improvement in respiratory symptoms. - Follow up with your doctor in the next week via telehealth or seek medical attention right away if your symptoms get WORSE.    Directions for you at home:  Wear a facemask You should wear a facemask that covers your nose and mouth when you are in the same room with other people and when you visit a healthcare provider. People who live with or visit you should also wear a facemask while they are in the same room with you.  Separate yourself from other people in your home As much as possible, you should stay in a different room from other people in your home. Also, you should use a separate bathroom, if available.  Avoid sharing household items You  should not share dishes, drinking glasses, cups, eating utensils, towels, bedding, or other items with other people in your home. After using these items, you should wash them thoroughly with soap and water.  Cover your coughs and sneezes Cover your mouth and nose with a tissue when you cough or sneeze, or you can cough or sneeze into your sleeve. Throw used tissues in a lined trash can, and immediately wash your hands with soap and water for at least 20 seconds or use an alcohol-based hand rub.  Wash your [employer] your hands often and thoroughly with soap and water for at least 20 seconds. You can use an alcohol-based hand sanitizer if soap and water are not available and if your hands are not visibly dirty. Avoid touching your eyes, nose, and mouth with unwashed hands.  Directions for those who live with, or provide care at home for you:  Limit the number of people who have contact with the patient If possible, have only one caregiver for the patient. Other household members should stay in another home or place of residence. If this is not possible, they should stay in another room, or be separated from the patient as much as possible. Use a separate bathroom, if available. Restrict visitors who do not have an essential need to be in the home.  Ensure good ventilation Make sure that shared spaces in the home have good air flow, such as from an air conditioner or an opened window, weather permitting.  Wash your hands often Wash your hands often and thoroughly with soap and water for at least 20 seconds. You can use an alcohol based hand sanitizer if soap and water are not available and if your hands are not visibly dirty. Avoid touching your eyes, nose, and mouth with unwashed hands. Use disposable paper towels to dry your hands. If not available, use dedicated cloth towels and replace them when they become wet.  Wear a facemask and gloves Wear a disposable facemask at all times in  the room and gloves when you touch or have contact with the patient's blood, body fluids, and/or secretions or excretions, such as sweat, saliva, sputum, nasal mucus, vomit, urine, or feces.  Ensure the mask fits over your nose and mouth tightly, and do not touch it during use. Throw out disposable facemasks and gloves after using them. Do not reuse. Wash your hands immediately after removing your facemask and gloves. If your personal clothing becomes contaminated, carefully remove clothing and launder. Wash your hands after handling contaminated clothing. Place all used disposable facemasks, gloves, and other waste in a lined container before disposing them with other household waste. Remove gloves and  wash your hands immediately after handling these items.  Do not share dishes, glasses, or other household items with the patient Avoid sharing household items. You should not share dishes, drinking glasses, cups, eating utensils, towels, bedding, or other items with a patient who is confirmed to have, or being evaluated for, COVID-19 infection. After the person uses these items, you should wash them thoroughly with soap and water.  Wash laundry thoroughly Immediately remove and wash clothes or bedding that have blood, body fluids, and/or secretions or excretions, such as sweat, saliva, sputum, nasal mucus, vomit, urine, or feces, on them. Wear gloves when handling laundry from the patient. Read and follow directions on labels of laundry or clothing items and detergent. In general, wash and dry with the warmest temperatures recommended on the label.  Clean all areas the individual has used often Clean all touchable surfaces, such as counters, tabletops, doorknobs, bathroom fixtures, toilets, phones, keyboards, tablets, and bedside tables, every day. Also, clean any surfaces that may have blood, body fluids, and/or secretions or excretions on them. Wear gloves when cleaning surfaces the patient has  come in contact with. Use a diluted bleach solution (e.g., dilute bleach with 1 part bleach and 10 parts water) or a household disinfectant with a label that says EPA-registered for coronaviruses. To make a bleach solution at home, add 1 tablespoon of bleach to 1 quart (4 cups) of water. For a larger supply, add  cup of bleach to 1 gallon (16 cups) of water. Read labels of cleaning products and follow recommendations provided on product labels. Labels contain instructions for safe and effective use of the cleaning product including precautions you should take when applying the product, such as wearing gloves or eye protection and making sure you have good ventilation during use of the product. Remove gloves and wash hands immediately after cleaning.  Monitor yourself for signs and symptoms of illness Caregivers and household members are considered close contacts, should monitor their health, and will be asked to limit movement outside of the home to the extent possible. Follow the monitoring steps for close contacts listed on the symptom monitoring form.   If you have additional questions, contact your local health department or call the epidemiologist on call at [phone] (available 24/7). This guidance is subject to change. For the most up-to-date guidance from [facility], please refer to their website: [email or web address]   You were cared for by a hospitalist during your hospital stay. If you have any questions about your discharge medications or the care you received while you were in the hospital after you are discharged, you can call the unit and asked to speak with the hospitalist on call if the hospitalist that took care of you is not available. Once you are discharged, your primary care physician will handle any further medical issues. Please note that NO REFILLS for any discharge medications will be authorized once you are discharged, as  it is imperative that you return to your primary care physician (or establish a relationship with a primary care physician if you do not have one) for your aftercare needs so that they can reassess your need for medications and monitor your lab values. If you do not have a primary care physician, you can call [phone] for a physician referral.   Increase activity slowly   Complete by: As directed         Allergies as of 01/16/2020   No Known Allergies     Medication List    TAKE  these medications   acetaminophen 500 MG tablet Commonly known as: TYLENOL Take 1,000 mg by mouth every 6 (six) hours as needed for headache (pain).   bismuth subsalicylate 262 MG/15ML suspension Commonly known as: PEPTO BISMOL Take 30 mLs by mouth every 6 (six) hours as needed for diarrhea or loose stools.   doxycycline 100 MG tablet Commonly known as: VIBRA-TABS Take 1 tablet (100 mg total) by mouth every 12 (twelve) hours for 4 days.   famotidine 20 MG tablet Commonly known as: PEPCID Take 1 tablet (20 mg total) by mouth daily for 10 days. Start taking on: January 17, 2020   ibuprofen 200 MG tablet Commonly known as: ADVIL Take 400 mg by mouth every 6 (six) hours as needed for headache (pain).   predniSONE 20 MG tablet Commonly known as: DELTASONE Take 3 tablets once daily for 3 days followed by 2 tablets once daily for 3 days followed by 1 tablet once daily for 3 days and then stop Notes to patient: With breakfast          TOTAL DISCHARGE TIME: 35 minutes  Mary Hockey [employer] on [email or web address]  01/16/2020, 1:35 PM

## 2020-01-16 NOTE — Progress Notes (Signed)
Brief Nutrition Education Note   RD consulted for nutrition education regarding diabetes.   Lab Results  Component Value Date   HGBA1C 7.8 (H) 01/15/2020   PTA DM medications are none.   Labs reviewed: [facility]GS: [id number] (inpatient orders for glycemic control are 0-20 units inuslin asart TID with meals and 0-5 unit sinuslin aspart daily at bedtime).   Attempted to speak with pt via call to hospital room phone, however, no answer. Of note, pt will be discharging today. Remdesivir infusions have been arranged.   RD provided "Carbohydrate Counting for People with Diabetes" handout from the Academy of [employer] Dietetics. Attached to AVS/discharge summary.   Body mass index is 37.49 kg/m. Pt meets criteria for obesity, class II based on current BMI. Obesity is a complex, chronic medical condition that is optimally managed by a multidisciplinary care team. Weight loss is not an ideal goal for an acute inpatient hospitalization. However, if further work-up for obesity is warranted, consider outpatient referral to outpatient bariatric service and/or [facility].   Current diet order is regular, patient is consuming approximately 40-100% of meals at this time. Labs and medications reviewed. No further nutrition interventions warranted at this time. RD contact information provided. If additional nutrition issues arise, please re-consult RD.  Levada Schilling, RD, LDN, CDCES Registered Dietitian II Certified Diabetes Care and Education Specialist Please refer to [facility] for RD and/or RD on-call/weekend/after hours pager

## 2020-01-16 NOTE — Progress Notes (Signed)
Patient scheduled for outpatient Remdesivir infusions at 9am on Saturday 9/4 and Sunday 9/5 at [facility]. Please inform the patient to park at [address] [place], [place], as staff will be escorting the patient through the east entrance of the hospital.    There is a wave flag banner located near the entrance on [address]. Turn into this entrance and immediately turn left and park in 1 of the 5 designated Covid Infusion Parking spots. There is a phone number on the sign, please call and let the staff know what spot you are in and we will come out and get you. For questions call [phone].  Thanks.

## 2020-01-16 NOTE — Discharge Instructions (Addendum)
Patient scheduled for outpatient Remdesivir infusions at 9am on Saturday 9/4 and Sunday 9/5 at [facility]. Please inform the patient to park at [address] [place], [place], as staff will be escorting the patient through the east entrance of the hospital. Appointments will take approximately 45 minutes.    There is a wave flag banner located near the entrance on [address]. Turn into this entrance and immediately turn left and park in 1 of the 5 designated Covid Infusion Parking spots. There is a phone number on the sign, please call and let the staff know what spot you are in and we will come out and get you. For questions call [phone].  Thanks.     COVID-19 COVID-19 is a respiratory infection that is caused by a virus called severe acute respiratory syndrome coronavirus 2 (SARS-CoV-2). The disease is also known as coronavirus disease or novel coronavirus. In some people, the virus may not cause any symptoms. In others, it may cause a serious infection. The infection can get worse quickly and can lead to complications, such as:  Pneumonia, or infection of the lungs.  Acute respiratory distress syndrome or ARDS. This is a condition in which fluid build-up in the lungs prevents the lungs from filling with air and passing oxygen into the blood.  Acute respiratory failure. This is a condition in which there is not enough oxygen passing from the lungs to the body or when carbon dioxide is not passing from the lungs out of the body.  Sepsis or septic shock. This is a serious bodily reaction to an infection.  Blood clotting problems.  Secondary infections due to bacteria or fungus.  Organ failure. This is when your body's organs stop working. The virus that causes COVID-19 is contagious. This means that it can spread from person to person through droplets from coughs and sneezes (respiratory secretions). What are the causes? This illness is caused by a virus. You may catch the virus  by:  Breathing in droplets from an infected person. Droplets can be spread by a person breathing, speaking, singing, coughing, or sneezing.  Touching something, like a table or a doorknob, that was exposed to the virus (contaminated) and then touching your mouth, nose, or eyes. What increases the risk? Risk for infection You are more likely to be infected with this virus if you:  Are within 6 feet (2 meters) of a person with COVID-19.  Provide care for or live with a person who is infected with COVID-19.  Spend time in crowded indoor spaces or live in shared housing. Risk for serious illness You are more likely to become seriously ill from the virus if you:  Are 75 years of age or older. The higher your age, the more you are at risk for serious illness.  Live in a nursing home or long-term care facility.  Have cancer.  Have a long-term (chronic) disease such as: ? Chronic lung disease, including chronic obstructive pulmonary disease or asthma. ? A long-term disease that lowers your body's ability to fight infection (immunocompromised). ? Heart disease, including heart failure, a condition in which the arteries that lead to the heart become narrow or blocked (coronary artery disease), a disease which makes the heart muscle thick, weak, or stiff (cardiomyopathy). ? Diabetes. ? Chronic kidney disease. ? Sickle cell disease, a condition in which red blood cells have an abnormal "sickle" shape. ? Liver disease.  Are obese. What are the signs or symptoms? Symptoms of this condition  can range from mild to severe. Symptoms may appear any time from 2 to 14 days after being exposed to the virus. They include:  A fever or chills.  A cough.  Difficulty breathing.  Headaches, body aches, or muscle aches.  Runny or stuffy (congested) nose.  A sore throat.  New loss of taste or smell. Some people may also have stomach problems, such as nausea, vomiting, or diarrhea. Other people  may not have any symptoms of COVID-19. How is this diagnosed? This condition may be diagnosed based on:  Your signs and symptoms, especially if: ? You live in an area with a COVID-19 outbreak. ? You recently traveled to or from an area where the virus is common. ? You provide care for or live with a person who was diagnosed with COVID-19. ? You were exposed to a person who was diagnosed with COVID-19.  A physical exam.  Lab tests, which may include: ? Taking a sample of fluid from the back of your nose and throat (nasopharyngeal fluid), your nose, or your throat using a swab. ? A sample of mucus from your lungs (sputum). ? Blood tests.  Imaging tests, which may include, X-rays, CT scan, or ultrasound. How is this treated? At present, there is no medicine to treat COVID-19. Medicines that treat other diseases are being used on a trial basis to see if they are effective against COVID-19. Your health care provider will talk with you about ways to treat your symptoms. For most people, the infection is mild and can be managed at home with rest, fluids, and over-the-counter medicines. Treatment for a serious infection usually takes places in a hospital intensive care unit (ICU). It may include one or more of the following treatments. These treatments are given until your symptoms improve.  Receiving fluids and medicines through an IV.  Supplemental oxygen. Extra oxygen is given through a tube in the nose, a face mask, or a hood.  Positioning you to lie on your stomach (prone position). This makes it easier for oxygen to get into the lungs.  Continuous positive airway pressure (CPAP) or bi-level positive airway pressure (BPAP) machine. This treatment uses mild air pressure to keep the airways open. A tube that is connected to a motor delivers oxygen to the body.  Ventilator. This treatment moves air into and out of the lungs by using a tube that is placed in your windpipe.  Tracheostomy.  This is a procedure to create a hole in the neck so that a breathing tube can be inserted.  Extracorporeal membrane oxygenation (ECMO). This procedure gives the lungs a chance to recover by taking over the functions of the heart and lungs. It supplies oxygen to the body and removes carbon dioxide. Follow these instructions at home: Lifestyle  If you are sick, stay home except to get medical care. Your health care provider will tell you how long to stay home. Call your health care provider before you go for medical care.  Rest at home as told by your health care provider.  Do not use any products that contain nicotine or tobacco, such as cigarettes, e-cigarettes, and chewing tobacco. If you need help quitting, ask your health care provider.  Return to your normal activities as told by your health care provider. Ask your health care provider what activities are safe for you. General instructions  Take over-the-counter and prescription medicines only as told by your health care provider.  Drink enough fluid to keep your urine pale yellow.  Keep all follow-up visits as told by your health care provider. This is important. How is this prevented?  There is no vaccine to help prevent COVID-19 infection. However, there are steps you can take to protect yourself and others from this virus. To protect yourself:   Do not travel to areas where COVID-19 is a risk. The areas where COVID-19 is reported change often. To identify high-risk areas and travel restrictions, check the CDC travel website: StageSync.si  If you live in, or must travel to, an area where COVID-19 is a risk, take precautions to avoid infection. ? Stay away from people who are sick. ? Wash your hands often with soap and water for 20 seconds. If soap and water are not available, use an alcohol-based hand sanitizer. ? Avoid touching your mouth, face, eyes, or nose. ? Avoid going out in public, follow guidance from  your state and local health authorities. ? If you must go out in public, wear a cloth face covering or face mask. Make sure your mask covers your nose and mouth. ? Avoid crowded indoor spaces. Stay at least 6 feet (2 meters) away from others. ? Disinfect objects and surfaces that are frequently touched every day. This may include:  Counters and tables.  Doorknobs and light switches.  Sinks and faucets.  Electronics, such as phones, remote controls, keyboards, computers, and tablets. To protect others: If you have symptoms of COVID-19, take steps to prevent the virus from spreading to others.  If you think you have a COVID-19 infection, contact your health care provider right away. Tell your health care team that you think you may have a COVID-19 infection.  Stay home. Leave your house only to seek medical care. Do not use public transport.  Do not travel while you are sick.  Wash your hands often with soap and water for 20 seconds. If soap and water are not available, use alcohol-based hand sanitizer.  Stay away from other members of your household. Let healthy household members care for children and pets, if possible. If you have to care for children or pets, wash your hands often and wear a mask. If possible, stay in your own room, separate from others. Use a different bathroom.  Make sure that all people in your household wash their hands well and often.  Cough or sneeze into a tissue or your sleeve or elbow. Do not cough or sneeze into your hand or into the air.  Wear a cloth face covering or face mask. Make sure your mask covers your nose and mouth. Where to find more information  Centers for Disease Control and Prevention: StickerEmporium.tn  World Health Organization: https://thompson-craig.com/ Contact a health care provider if:  You live in or have traveled to an area where COVID-19 is a risk and you have symptoms of the  infection.  You have had contact with someone who has COVID-19 and you have symptoms of the infection. Get help right away if:  You have trouble breathing.  You have pain or pressure in your chest.  You have confusion.  You have bluish lips and fingernails.  You have difficulty waking from sleep.  You have symptoms that get worse. These symptoms may represent a serious problem that is an emergency. Do not wait to see if the symptoms will go away. Get medical help right away. Call your local emergency services (911 in the [place] Do not drive yourself to the hospital. Let the emergency medical personnel know if you think you  have COVID-19. Summary  COVID-19 is a respiratory infection that is caused by a virus. It is also known as coronavirus disease or novel coronavirus. It can cause serious infections, such as pneumonia, acute respiratory distress syndrome, acute respiratory failure, or sepsis.  The virus that causes COVID-19 is contagious. This means that it can spread from person to person through droplets from breathing, speaking, singing, coughing, or sneezing.  You are more likely to develop a serious illness if you are 10 years of age or older, have a weak immune system, live in a nursing home, or have chronic disease.  There is no medicine to treat COVID-19. Your health care provider will talk with you about ways to treat your symptoms.  Take steps to protect yourself and others from infection. Wash your hands often and disinfect objects and surfaces that are frequently touched every day. Stay away from people who are sick and wear a mask if you are sick. This information is not intended to replace advice given to you by your health care provider. Make sure you discuss any questions you have with your health care provider. Document Revised: 02/28/2019 Document Reviewed: 06/06/2018 Elsevier Patient Education  2020 [employer].  Carbohydrate Counting For People With  Diabetes  Foods with carbohydrates make your blood glucose level go up. Learning how to count carbohydrates can help you control your blood glucose levels. First, identify the foods you eat that contain carbohydrates. Then, using the Foods with Carbohydrates chart, determine about how much carbohydrates are in your meals and snacks. Make sure you are eating foods with fiber, protein, and healthy fat along with your carbohydrate foods. Foods with Carbohydrates The following table shows carbohydrate foods that have about 15 grams of carbohydrate each. Using measuring cups, spoons, or a food scale when you first begin learning about carbohydrate counting can help you learn about the portion sizes you typically eat. The following foods have 15 grams carbohydrate each:  Grains . 1 slice bread (1 ounce)  . 1 small tortilla (6-inch size)  .  large bagel (1 ounce)  . 1/3 cup pasta or rice (cooked)  .  hamburger or hot dog bun ( ounce)  .  cup cooked cereal  .  to  cup ready-to-eat cereal  . 2 taco shells (5-inch size) Fruit . 1 small fresh fruit ( to 1 cup)  .  medium banana  . 17 small grapes (3 ounces)  . 1 cup melon or berries  .  cup canned or frozen fruit  . 2 tablespoons dried fruit (blueberries, cherries, cranberries, raisins)  .  cup unsweetened fruit juice  Starchy Vegetables .  cup cooked beans, peas, corn, potatoes/sweet potatoes  .  large baked potato (3 ounces)  . 1 cup acorn or butternut squash  Snack Foods . 3 to 6 crackers  . 8 potato chips or 13 tortilla chips ( ounce to 1 ounce)  . 3 cups popped popcorn  Dairy . 3/4 cup (6 ounces) nonfat plain yogurt, or yogurt with sugar-free sweetener  . 1 cup milk  . 1 cup plain rice, soy, coconut or flavored almond milk Sweets and Desserts .  cup ice cream or frozen yogurt  . 1 tablespoon jam, jelly, pancake syrup, table sugar, or honey  . 2 tablespoons light pancake syrup  . 1 inch square of frosted cake or 2 inch  square of unfrosted cake  . 2 small cookies (2/3 ounce each) or  large cookie  Sometimes you'll have to  estimate carbohydrate amounts if you don't know the exact recipe. One cup of mixed foods like soups can have 1 to 2 carbohydrate servings, while some casseroles might have 2 or more servings of carbohydrate. Foods that have less than 20 calories in each serving can be counted as "free" foods. Count 1 cup raw vegetables, or  cup cooked non-starchy vegetables as "free" foods. If you eat 3 or more servings at one meal, then count them as 1 carbohydrate serving.  Foods without Carbohydrates  Not all foods contain carbohydrates. Meat, some dairy, fats, non-starchy vegetables, and many beverages don't contain carbohydrate. So when you count carbohydrates, you can generally exclude chicken, pork, beef, fish, seafood, eggs, tofu, cheese, butter, sour cream, avocado, nuts, seeds, olives, mayonnaise, water, black coffee, unsweetened tea, and zero-calorie drinks. Vegetables with no or low carbohydrate include green beans, cauliflower, tomatoes, and onions. How much carbohydrate should I eat at each meal?  Carbohydrate counting can help you plan your meals and manage your weight. Following are some starting points for carbohydrate intake at each meal. Work with your registered dietitian nutritionist to find the best range that works for your blood glucose and weight.   To Lose Weight To Maintain Weight  Women 2 - 3 carb servings 3 - 4 carb servings  Men 3 - 4 carb servings 4 - 5 carb servings  Checking your blood glucose after meals will help you know if you need to adjust the timing, type, or number of carbohydrate servings in your meal plan. Achieve and keep a healthy body weight by balancing your food intake and physical activity.  Tips How should I plan my meals?  Plan for half the food on your plate to include non-starchy vegetables, like salad greens, broccoli, or carrots. Try to eat 3 to 5 servings of  non-starchy vegetables every day. Have a protein food at each meal. Protein foods include chicken, fish, meat, eggs, or beans (note that beans contain carbohydrate). These two food groups (non-starchy vegetables and proteins) are low in carbohydrate. If you fill up your plate with these foods, you will eat less carbohydrate but still fill up your stomach. Try to limit your carbohydrate portion to  of the plate.  What fats are healthiest to eat?  Diabetes increases risk for heart disease. To help protect your heart, eat more healthy fats, such as olive oil, nuts, and avocado. Eat less saturated fats like butter, cream, and high-fat meats, like bacon and sausage. Avoid trans fats, which are in all foods that list "partially hydrogenated oil" as an ingredient. What should I drink?  Choose drinks that are not sweetened with sugar. The healthiest choices are water, carbonated or seltzer waters, and tea and coffee without added sugars.  Sweet drinks will make your blood glucose go up very quickly. One serving of soda or energy drink is  cup. It is best to drink these beverages only if your blood glucose is low.  Artificially sweetened, or diet drinks, typically do not increase your blood glucose if they have zero calories in them. Read labels of beverages, as some diet drinks do have carbohydrate and will raise your blood glucose. Label Reading Tips Read Nutrition Facts labels to find out how many grams of carbohydrate are in a food you want to eat. Don't forget: sometimes serving sizes on the label aren't the same as how much food you are going to eat, so you may need to calculate how much carbohydrate is in the food you  are serving yourself.   Carbohydrate Counting for People with Diabetes Sample 1-Day Menu  Breakfast  cup yogurt, low fat, low sugar (1 carbohydrate serving)   cup cereal, ready-to-eat, unsweetened (1 carbohydrate serving)  1 cup strawberries (1 carbohydrate serving)   cup almonds (  carbohydrate serving)  Lunch 1, 5 ounce can chunk light tuna  2 ounces cheese, low fat cheddar  6 whole wheat crackers (1 carbohydrate serving)  1 small apple (1 carbohydrate servings)   cup carrots ( carbohydrate serving)   cup snap peas  1 cup 1% milk (1 carbohydrate serving)   Evening Meal Stir fry made with: 3 ounces chicken  1 cup brown rice (3 carbohydrate servings)   cup broccoli ( carbohydrate serving)   cup green beans   cup onions  1 tablespoon olive oil  2 tablespoons teriyaki sauce ( carbohydrate serving)  Evening Snack 1 extra small banana (1 carbohydrate serving)  1 tablespoon peanut butter   Carbohydrate Counting for People with Diabetes Vegan Sample 1-Day Menu  Breakfast 1 cup cooked oatmeal (2 carbohydrate servings)   cup blueberries (1 carbohydrate serving)  2 tablespoons flaxseeds  1 cup soymilk fortified with calcium and vitamin D  1 cup coffee  Lunch 2 slices whole wheat bread (2 carbohydrate servings)   cup baked tofu   cup lettuce  2 slices tomato  2 slices avocado   cup baby carrots ( carbohydrate serving)  1 orange (1 carbohydrate serving)  1 cup soymilk fortified with calcium and vitamin D   Evening Meal Burrito made with: 1 6-inch corn tortilla (1 carbohydrate serving)  1 cup refried vegetarian beans (2 carbohydrate servings)   cup chopped tomatoes   cup lettuce   cup salsa  1/3 cup brown rice (1 carbohydrate serving)  1 tablespoon olive oil for rice   cup zucchini   Evening Snack 6 small whole grain crackers (1 carbohydrate serving)  2 apricots ( carbohydrate serving)   cup unsalted peanuts ( carbohydrate serving)    Carbohydrate Counting for People with Diabetes Vegetarian (Lacto-Ovo) Sample 1-Day Menu  Breakfast 1 cup cooked oatmeal (2 carbohydrate servings)   cup blueberries (1 carbohydrate serving)  2 tablespoons flaxseeds  1 egg  1 cup 1% milk (1 carbohydrate serving)  1 cup coffee  Lunch 2 slices whole wheat  bread (2 carbohydrate servings)  2 ounces low-fat cheese   cup lettuce  2 slices tomato  2 slices avocado   cup baby carrots ( carbohydrate serving)  1 orange (1 carbohydrate serving)  1 cup unsweetened tea  Evening Meal Burrito made with: 1 6-inch corn tortilla (1 carbohydrate serving)   cup refried vegetarian beans (1 carbohydrate serving)   cup tomatoes   cup lettuce   cup salsa  1/3 cup brown rice (1 carbohydrate serving)  1 tablespoon olive oil for rice   cup zucchini  1 cup 1% milk (1 carbohydrate serving)  Evening Snack 6 small whole grain crackers (1 carbohydrate serving)  2 apricots ( carbohydrate serving)   cup unsalted peanuts ( carbohydrate serving)    Copyright 2020  Academy of Nutrition and Dietetics. All rights reserved.  Using Nutrition Labels: Carbohydrate  . Serving Size  . Look at the serving size. All the information on the label is based on this portion. Jolyne Loa Per Container  . The number of servings contained in the package. . Guidelines for Carbohydrate  . Look at the total grams of carbohydrate in the serving size.  . 1 carbohydrate  choice = 15 grams of carbohydrate. Range of Carbohydrate Grams Per Choice  Carbohydrate Grams/Choice Carbohydrate Choices  6-10   11-20 1  21-25 1  26-35 2  36-40 2  41-50 3  51-55 3  56-65 4  66-70 4  71-80 5    Copyright 2020  Academy of Nutrition and Dietetics. All rights reserved.

## 2020-01-17 ENCOUNTER — Ambulatory Visit (HOSPITAL_COMMUNITY)
Admit: 2020-01-17 | Discharge: 2020-01-17 | Disposition: A | Discharge status: Home / Self Care | Payer: 59 | Source: Ambulatory Visit | Attending: Pulmonary Disease | Admitting: Pulmonary Disease

## 2020-01-17 DIAGNOSIS — U071 COVID-19: Secondary | ICD-10-CM | POA: Diagnosis present

## 2020-01-17 DIAGNOSIS — J1289 Other viral pneumonia: Secondary | ICD-10-CM | POA: Insufficient documentation

## 2020-01-17 MED ORDER — METHYLPREDNISOLONE SODIUM SUCC 125 MG IJ SOLR: 125.0000 mg | Freq: Once | INTRAMUSCULAR | Status: DC | PRN

## 2020-01-17 MED ORDER — FAMOTIDINE IN NACL 20-0.9 MG/50ML-% IV SOLN: 20.0000 mg | Freq: Once | INTRAVENOUS | Status: DC | PRN

## 2020-01-17 MED ORDER — SODIUM CHLORIDE 0.9 % IV SOLN: INTRAVENOUS | Status: DC | PRN

## 2020-01-17 MED ORDER — DIPHENHYDRAMINE HCL 50 MG/ML IJ SOLN: 50.0000 mg | Freq: Once | INTRAMUSCULAR | Status: DC | PRN

## 2020-01-17 MED ORDER — EPINEPHRINE 0.3 MG/0.3ML IJ SOAJ: 0.3000 mg | Freq: Once | INTRAMUSCULAR | Status: DC | PRN

## 2020-01-17 MED ORDER — ALBUTEROL SULFATE HFA 108 (90 BASE) MCG/ACT IN AERS: 2.0000 | INHALATION_SPRAY | Freq: Once | RESPIRATORY_TRACT | Status: DC | PRN

## 2020-01-17 MED ORDER — SODIUM CHLORIDE 0.9 % IV SOLN
100.0000 mg | Freq: Once | INTRAVENOUS | Status: AC
  Administered 2020-01-17: 100 mg via INTRAVENOUS
  Filled 2020-01-17: qty 20

## 2020-01-17 NOTE — Progress Notes (Signed)
  Diagnosis: COVID-19  Physician:Dr. Delford Field   Procedure: [facility] Med: remdesivir infusion - Provided patient with remdesivir fact sheet for patients, parents and caregivers prior to infusion.  Complications: No immediate complications noted.  Discharge: Discharged home   Manuel Rogers 01/17/2020

## 2020-01-17 NOTE — Discharge Instructions (Signed)
10 Things You Can Do to Manage Your COVID-19 Symptoms at Home If you have possible or confirmed COVID-19: 1. Stay home from work and school. And stay away from other public places. If you must go out, avoid using any kind of public transportation, ridesharing, or taxis. 2. Monitor your symptoms carefully. If your symptoms get worse, call your healthcare provider immediately. 3. Get rest and stay hydrated. 4. If you have a medical appointment, call the healthcare provider ahead of time and tell them that you have or may have COVID-19. 5. For medical emergencies, call 911 and notify the dispatch personnel that you have or may have COVID-19. 6. Cover your cough and sneezes with a tissue or use the inside of your elbow. 7. Wash your hands often with soap and water for at least 20 seconds or clean your hands with an alcohol-based hand sanitizer that contains at least 60% alcohol. 8. As much as possible, stay in a specific room and away from other people in your home. Also, you should use a separate bathroom, if available. If you need to be around other people in or outside of the home, wear a mask. 9. Avoid sharing personal items with other people in your household, like dishes, towels, and bedding. 10. Clean all surfaces that are touched often, like counters, tabletops, and doorknobs. Use household cleaning sprays or wipes according to the label instructions. cdc.gov/coronavirus 11/13/2018 This information is not intended to replace advice given to you by your health care provider. Make sure you discuss any questions you have with your health care provider. Document Revised: 04/17/2019 Document Reviewed: 04/17/2019 Elsevier Patient Education  2020 Elsevier Inc.  

## 2020-01-18 ENCOUNTER — Ambulatory Visit (HOSPITAL_COMMUNITY)
Admit: 2020-01-18 | Discharge: 2020-01-18 | Disposition: A | Discharge status: Home / Self Care | Payer: 59 | Source: Ambulatory Visit | Attending: Pulmonary Disease | Admitting: Pulmonary Disease

## 2020-01-18 DIAGNOSIS — U071 COVID-19: Secondary | ICD-10-CM | POA: Insufficient documentation

## 2020-01-18 DIAGNOSIS — J1282 Pneumonia due to coronavirus disease 2019: Secondary | ICD-10-CM | POA: Insufficient documentation

## 2020-01-18 MED ORDER — FAMOTIDINE IN NACL 20-0.9 MG/50ML-% IV SOLN: 20.0000 mg | Freq: Once | INTRAVENOUS | Status: DC | PRN

## 2020-01-18 MED ORDER — EPINEPHRINE 0.3 MG/0.3ML IJ SOAJ: 0.3000 mg | Freq: Once | INTRAMUSCULAR | Status: DC | PRN

## 2020-01-18 MED ORDER — DIPHENHYDRAMINE HCL 50 MG/ML IJ SOLN: 50.0000 mg | Freq: Once | INTRAMUSCULAR | Status: DC | PRN

## 2020-01-18 MED ORDER — METHYLPREDNISOLONE SODIUM SUCC 125 MG IJ SOLR: 125.0000 mg | Freq: Once | INTRAMUSCULAR | Status: DC | PRN

## 2020-01-18 MED ORDER — SODIUM CHLORIDE 0.9 % IV SOLN: INTRAVENOUS | Status: DC | PRN

## 2020-01-18 MED ORDER — ALBUTEROL SULFATE HFA 108 (90 BASE) MCG/ACT IN AERS: 2.0000 | INHALATION_SPRAY | Freq: Once | RESPIRATORY_TRACT | Status: DC | PRN

## 2020-01-18 MED ORDER — SODIUM CHLORIDE 0.9 % IV SOLN
100.0000 mg | Freq: Once | INTRAVENOUS | Status: AC
  Administered 2020-01-18: 100 mg via INTRAVENOUS
  Filled 2020-01-18: qty 20

## 2020-01-18 NOTE — Progress Notes (Signed)
  Diagnosis: COVID-19  Physician:Dr Wright   Procedure: [facility] Med: remdesivir infusion - Provided patient with remdesivir fact sheet for patients, parents and caregivers prior to infusion.  Complications: No immediate complications noted.  Discharge: Discharged home   Dynastee Brummell W 01/18/2020  

## 2020-01-18 NOTE — Discharge Instructions (Signed)
10 Things You Can Do to Manage Your COVID-19 Symptoms at Home If you have possible or confirmed COVID-19: 1. Stay home from work and school. And stay away from other public places. If you must go out, avoid using any kind of public transportation, ridesharing, or taxis. 2. Monitor your symptoms carefully. If your symptoms get worse, call your healthcare provider immediately. 3. Get rest and stay hydrated. 4. If you have a medical appointment, call the healthcare provider ahead of time and tell them that you have or may have COVID-19. 5. For medical emergencies, call 911 and notify the dispatch personnel that you have or may have COVID-19. 6. Cover your cough and sneezes with a tissue or use the inside of your elbow. 7. Wash your hands often with soap and water for at least 20 seconds or clean your hands with an alcohol-based hand sanitizer that contains at least 60% alcohol. 8. As much as possible, stay in a specific room and away from other people in your home. Also, you should use a separate bathroom, if available. If you need to be around other people in or outside of the home, wear a mask. 9. Avoid sharing personal items with other people in your household, like dishes, towels, and bedding. 10. Clean all surfaces that are touched often, like counters, tabletops, and doorknobs. Use household cleaning sprays or wipes according to the label instructions. cdc.gov/coronavirus 11/13/2018 This information is not intended to replace advice given to you by your health care provider. Make sure you discuss any questions you have with your health care provider. Document Revised: 04/17/2019 Document Reviewed: 04/17/2019 Elsevier Patient Education  2020 Elsevier Inc.  

## 2021-09-19 IMAGING — CT CT ABD-PELV W/ CM
2 of 6 series · 14 of 46 positions shown, 16 images · IV contrast (APPLIED)
Comparison: CT 02/18/2016, ultrasound 04/25/2010

CLINICAL DATA: [id number] positive 14 days ago now unable to tolerate
p.o. intake for 8 days with abdominal pain, nausea, vomiting,
diarrhea, weakness and fever.

EXAM:
CT ANGIOGRAPHY CHEST
CT ABDOMEN AND PELVIS WITH CONTRAST
TECHNIQUE: Multidetector CT imaging of the chest was performed using the
standard protocol during bolus administration of intravenous
contrast. Multiplanar CT image reconstructions and MIPs were
obtained to evaluate the vascular anatomy. Multidetector CT imaging
of the abdomen and pelvis was performed using the standard protocol
during bolus administration of intravenous contrast.
CONTRAST:  100mL OMNIPAQUE IOHEXOL 350 MG/ML SOLN

[Series 4: thins · axial · 0.98mm/px · z∈[+755,+1275]mm · 11 of 598 slices shown, 13 images]
[im 52/598  soft-tissue]
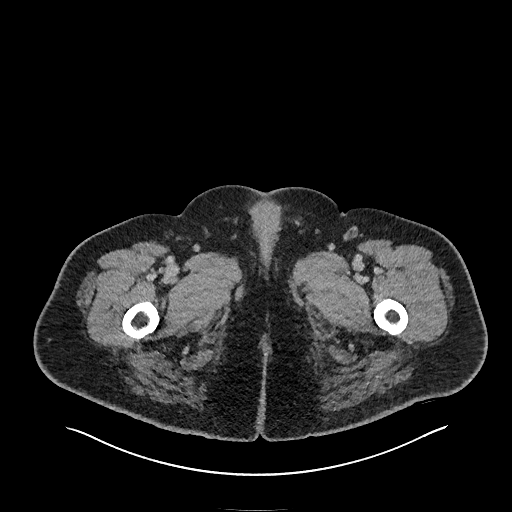
[im 52/598  bone]
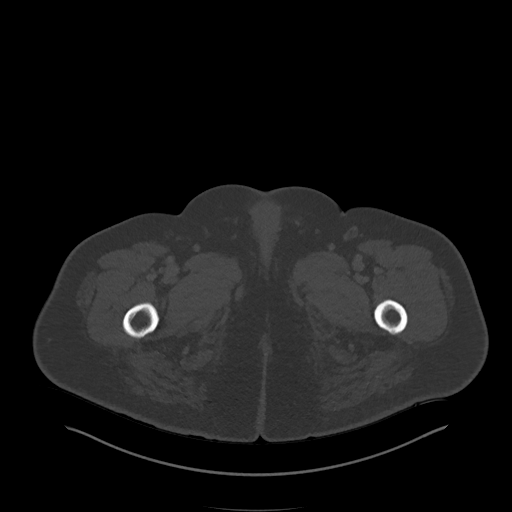
[im 104/598  soft-tissue]
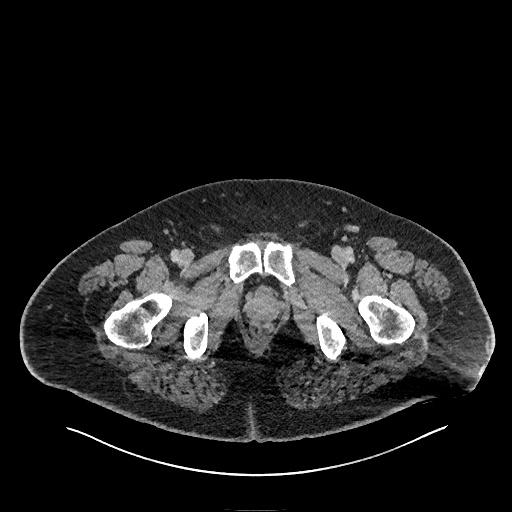
[im 156/598  soft-tissue]
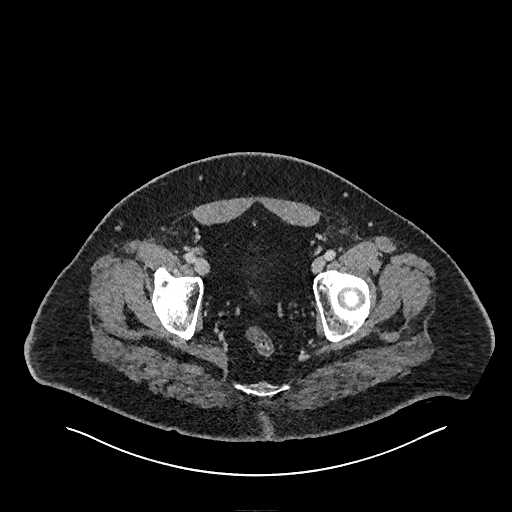
[im 208/598  soft-tissue]
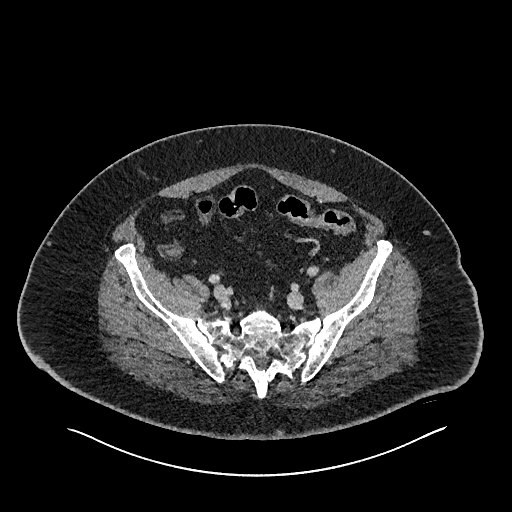
[im 260/598  soft-tissue]
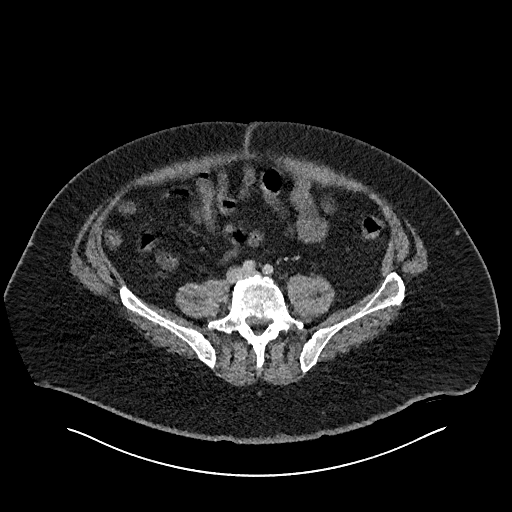
[im 312/598  soft-tissue]
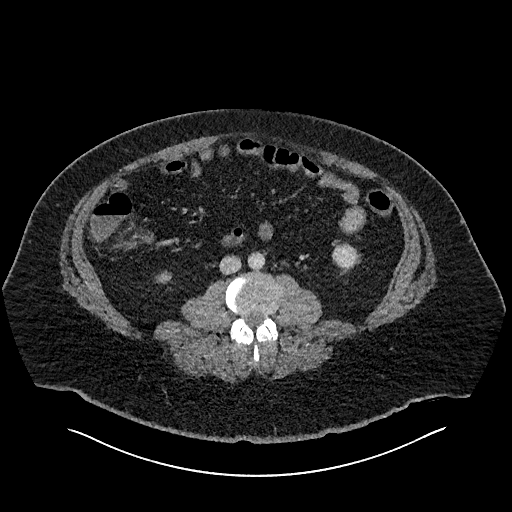
[im 364/598  soft-tissue]
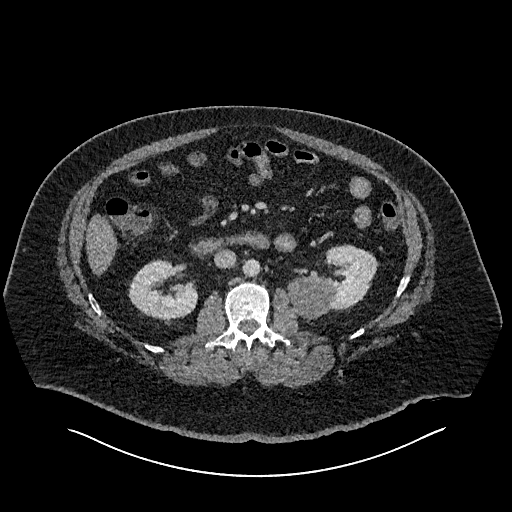
[im 416/598  soft-tissue]
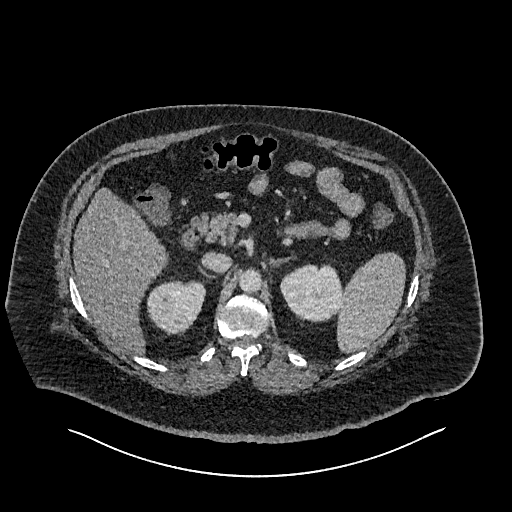
[im 468/598  soft-tissue]
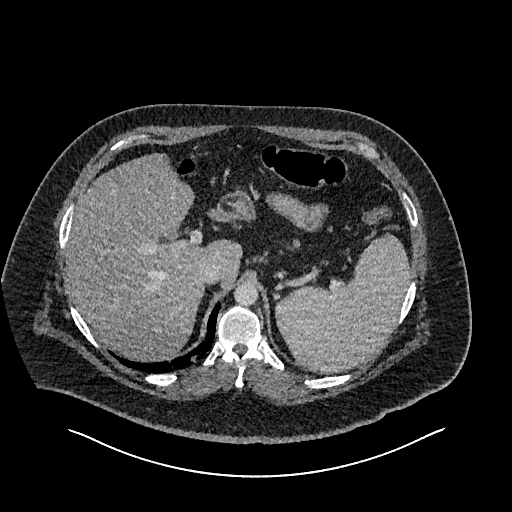
[im 468/598  bone]
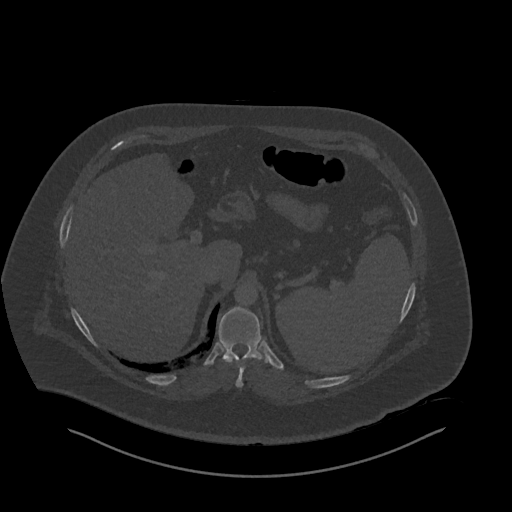
[im 520/598  soft-tissue]
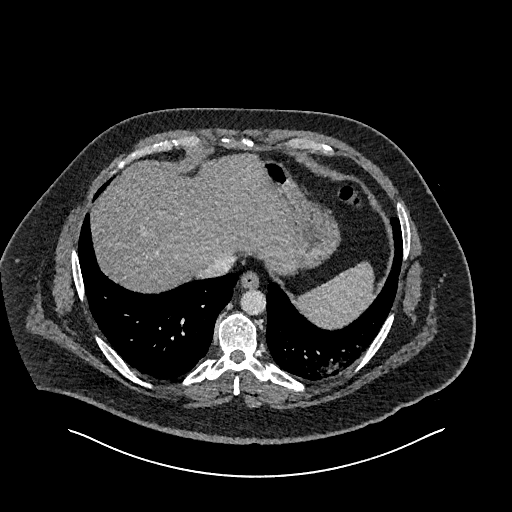
[im 572/598  soft-tissue]
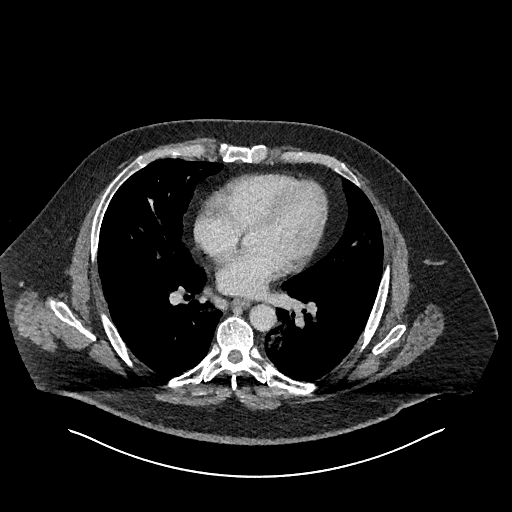

[Series 6: coronals · coronal · 1.16mm/px · 3 of 128 slices shown]
[im 43/128  soft-tissue]
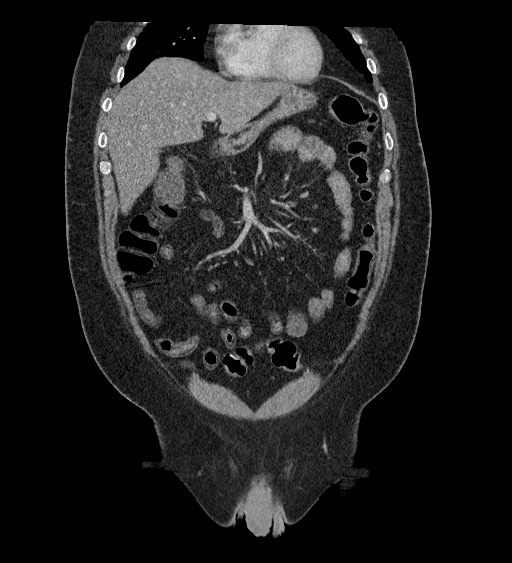
[im 57/128  soft-tissue]
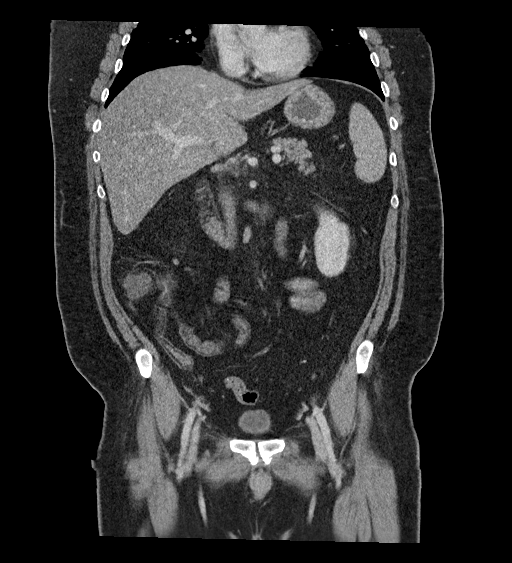
[im 71/128  soft-tissue]
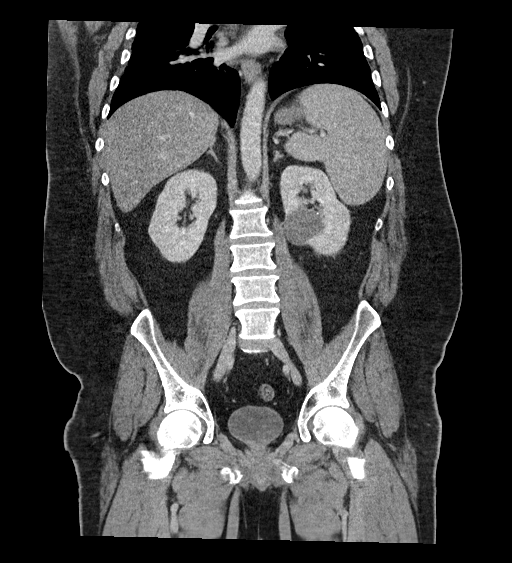

[14 of 46 positions shown; findings below may reference images not displayed]

FINDINGS: CTA CHEST FINDINGS

Cardiovascular: Satisfactory opacification the pulmonary arteries to
the segmental level. No pulmonary artery filling defects are
identified. Central pulmonary arteries are normal caliber. Normal
heart size. No pericardial effusion. The aorta is normal caliber. No
acute luminal abnormality or periaortic stranding or hemorrhage.
Normal 3 vessel branching of the aortic arch. Proximal great vessels
are unremarkable.

Mediastinum/Nodes: Few scattered subcentimeter mediastinal and hilar
nodes as well as a slightly larger right paratracheal lymph node
([DATE]) measuring up to 1.3 cm in short axis. No other concerning
mediastinal, hilar or axillary adenopathy. Thyroid gland and
thoracic inlet are unremarkable. No acute abnormality of the trachea
or esophagus.

Lungs/Pleura: Multifocal areas of mixed ground-glass and
consolidative opacity are present in the lungs. Mild airways
thickening as well. No pneumothorax or visible effusion. No
convincing features of edema.

Musculoskeletal: Focal soft tissue thickening and fat subcutaneous
fat stranding in the soft tissues of the anterior right shoulder
([DATE]). No soft tissue gas or foreign body. No other acute chest wall
soft tissue abnormality. No acute or suspicious osseous
abnormalities of the chest wall. Mild multilevel degenerative
changes in the included thoracic spine.

Review of the MIP images confirms the above findings.

CT ABDOMEN and PELVIS FINDINGS

Hepatobiliary: Diffuse hepatic hypoattenuation compatible with
hepatic steatosis. No focal liver abnormality is seen. Patient is
post cholecystectomy. No significant biliary ductal dilatation. No
calcified intraductal gallstones.

Pancreas: Unremarkable. No pancreatic ductal dilatation or
surrounding inflammatory changes.

Spleen: Normal in size. No concerning splenic lesions.

Adrenals/Urinary Tract: Normal adrenal glands. Kidneys enhance and
excrete symmetrically. There is a intermediate attenuation lobular
cystic structure arising from the upper pole left kidney measuring
up to 4.6 cm in size which is not significantly changed from
comparison ultrasound in 6466 favoring a benign proteinaceous cyst.

Stomach/Bowel: Distal esophagus, stomach and duodenal sweep are
unremarkable. No small bowel wall thickening or dilatation. No
evidence of obstruction. A normal appendix is visualized. No colonic
dilatation or wall thickening. Intramural fat is seen at the level
of the duodenum, terminal ileum and cecum is a nonspecific finding
which can reflect sequela of prior inflammation/inflammatory bowel
disease as well as incidentally with a lipomatous body habitus.

Vascular/Lymphatic: Minimal atherosclerotic plaque in the iliac
arteries. No aortic aneurysm or ectasia. No other significant
vascular findings. No suspicious or enlarged lymph nodes in the
included lymphatic chains.

Reproductive: The prostate and seminal vesicles are unremarkable.

Other: No abdominopelvic free fluid or free gas. No bowel containing
hernias. Small fat containing umbilical and inguinal hernias.

Musculoskeletal: Minimal degenerative changes in the spine. No acute
or suspicious osseous lesions.

Review of the MIP images confirms the above findings.
IMPRESSION: 1. No evidence of pulmonary embolism.
2. Multifocal areas of mixed ground-glass and consolidative opacity
in the lungs, consistent with a multifocal pneumonia in the setting
of known [id number] positivity.
3. Focal soft tissue thickening and fat subcutaneous fat stranding
in the soft tissues of the anterior right shoulder, correlate with
visual inspection. No soft tissue gas or foreign body.
4. No acute intra-abdominal process.
5. Hepatic steatosis.
6. Stable intermediate attenuation partially exophytic lesion in the
interpolar to upper pole left kidney. Cystic appearance on
comparison ultrasound 04/25/2010 and unchanged in size or appearance
from comparison CT, strongly favoring benign proteinaceous cyst. If
further characterization is clinically warranted, repeat outpatient
ultrasound or contrast enhanced MRI could be obtained.

## 2023-11-23 ENCOUNTER — Ambulatory Visit: Payer: Self-pay

## 2023-11-23 DIAGNOSIS — Z1211 Encounter for screening for malignant neoplasm of colon: Secondary | ICD-10-CM | POA: Diagnosis present
